# Patient Record
Sex: Male | Born: 1975 | Race: White | Hispanic: No | Marital: Married | Smoking: Current every day smoker
Health system: Southern US, Community
[De-identification: ages and names within clinical notes are randomized; demographics above are authoritative.]

## PROBLEM LIST (undated history)

## (undated) DIAGNOSIS — F419 Anxiety disorder, unspecified: Secondary | ICD-10-CM

## (undated) DIAGNOSIS — R569 Unspecified convulsions: Secondary | ICD-10-CM

## (undated) DIAGNOSIS — F259 Schizoaffective disorder, unspecified: Secondary | ICD-10-CM

## (undated) DIAGNOSIS — R51 Headache: Secondary | ICD-10-CM

## (undated) DIAGNOSIS — R7689 Other specified abnormal immunological findings in serum: Secondary | ICD-10-CM

## (undated) DIAGNOSIS — K8020 Calculus of gallbladder without cholecystitis without obstruction: Secondary | ICD-10-CM

## (undated) DIAGNOSIS — R0600 Dyspnea, unspecified: Secondary | ICD-10-CM

## (undated) DIAGNOSIS — R768 Other specified abnormal immunological findings in serum: Secondary | ICD-10-CM

## (undated) DIAGNOSIS — Z87820 Personal history of traumatic brain injury: Secondary | ICD-10-CM

## (undated) DIAGNOSIS — N189 Chronic kidney disease, unspecified: Secondary | ICD-10-CM

## (undated) DIAGNOSIS — M5030 Other cervical disc degeneration, unspecified cervical region: Secondary | ICD-10-CM

## (undated) DIAGNOSIS — F4310 Post-traumatic stress disorder, unspecified: Secondary | ICD-10-CM

## (undated) DIAGNOSIS — F319 Bipolar disorder, unspecified: Secondary | ICD-10-CM

## (undated) DIAGNOSIS — I10 Essential (primary) hypertension: Secondary | ICD-10-CM

## (undated) DIAGNOSIS — J449 Chronic obstructive pulmonary disease, unspecified: Secondary | ICD-10-CM

## (undated) DIAGNOSIS — F909 Attention-deficit hyperactivity disorder, unspecified type: Secondary | ICD-10-CM

## (undated) HISTORY — DX: Schizoaffective disorder, unspecified: F25.9

## (undated) HISTORY — DX: Bipolar disorder, unspecified: F31.9

## (undated) HISTORY — DX: Anxiety disorder, unspecified: F41.9

## (undated) HISTORY — DX: Headache: R51

## (undated) HISTORY — DX: Post-traumatic stress disorder, unspecified: F43.10

## (undated) HISTORY — DX: Chronic kidney disease, unspecified: N18.9

## (undated) HISTORY — DX: Other specified abnormal immunological findings in serum: R76.89

## (undated) HISTORY — PX: RENAL ARTERY STENT: SHX2321

## (undated) HISTORY — DX: Attention-deficit hyperactivity disorder, unspecified type: F90.9

## (undated) HISTORY — DX: Chronic obstructive pulmonary disease, unspecified: J44.9

## (undated) HISTORY — PX: CHOLECYSTECTOMY: SHX55

## (undated) HISTORY — DX: Calculus of gallbladder without cholecystitis without obstruction: K80.20

## (undated) HISTORY — DX: Personal history of traumatic brain injury: Z87.820

## (undated) HISTORY — DX: Other specified abnormal immunological findings in serum: R76.8

---

## 2000-07-11 ENCOUNTER — Emergency Department (HOSPITAL_COMMUNITY): Admission: EM | Admit: 2000-07-11 | Discharge: 2000-07-11 | Payer: Self-pay | Admitting: *Deleted

## 2000-07-11 ENCOUNTER — Encounter: Payer: Self-pay | Admitting: *Deleted

## 2000-10-25 ENCOUNTER — Emergency Department (HOSPITAL_COMMUNITY): Admission: EM | Admit: 2000-10-25 | Discharge: 2000-10-25 | Payer: Self-pay | Admitting: Emergency Medicine

## 2000-11-17 ENCOUNTER — Encounter: Payer: Self-pay | Admitting: *Deleted

## 2000-11-17 ENCOUNTER — Emergency Department (HOSPITAL_COMMUNITY): Admission: EM | Admit: 2000-11-17 | Discharge: 2000-11-17 | Payer: Self-pay | Admitting: *Deleted

## 2001-04-24 ENCOUNTER — Emergency Department (HOSPITAL_COMMUNITY): Admission: EM | Admit: 2001-04-24 | Discharge: 2001-04-24 | Payer: Self-pay | Admitting: Emergency Medicine

## 2001-10-17 ENCOUNTER — Emergency Department (HOSPITAL_COMMUNITY): Admission: EM | Admit: 2001-10-17 | Discharge: 2001-10-17 | Payer: Self-pay | Admitting: *Deleted

## 2001-10-17 ENCOUNTER — Encounter: Payer: Self-pay | Admitting: *Deleted

## 2001-11-08 ENCOUNTER — Encounter: Payer: Self-pay | Admitting: Orthopaedic Surgery

## 2001-11-08 ENCOUNTER — Ambulatory Visit (HOSPITAL_COMMUNITY): Admission: RE | Admit: 2001-11-08 | Discharge: 2001-11-08 | Payer: Self-pay | Admitting: Orthopaedic Surgery

## 2001-11-28 ENCOUNTER — Encounter: Payer: Self-pay | Admitting: Orthopaedic Surgery

## 2001-11-28 ENCOUNTER — Ambulatory Visit (HOSPITAL_COMMUNITY): Admission: RE | Admit: 2001-11-28 | Discharge: 2001-11-28 | Payer: Self-pay | Admitting: Orthopaedic Surgery

## 2001-12-15 ENCOUNTER — Emergency Department (HOSPITAL_COMMUNITY): Admission: EM | Admit: 2001-12-15 | Discharge: 2001-12-15 | Payer: Self-pay | Admitting: Emergency Medicine

## 2002-01-31 ENCOUNTER — Encounter: Payer: Self-pay | Admitting: Emergency Medicine

## 2002-01-31 ENCOUNTER — Emergency Department (HOSPITAL_COMMUNITY): Admission: EM | Admit: 2002-01-31 | Discharge: 2002-02-01 | Payer: Self-pay | Admitting: Emergency Medicine

## 2002-03-17 ENCOUNTER — Emergency Department (HOSPITAL_COMMUNITY): Admission: EM | Admit: 2002-03-17 | Discharge: 2002-03-17 | Payer: Self-pay | Admitting: *Deleted

## 2002-04-14 ENCOUNTER — Emergency Department (HOSPITAL_COMMUNITY): Admission: EM | Admit: 2002-04-14 | Discharge: 2002-04-14 | Payer: Self-pay | Admitting: Internal Medicine

## 2003-01-17 ENCOUNTER — Emergency Department (HOSPITAL_COMMUNITY): Admission: EM | Admit: 2003-01-17 | Discharge: 2003-01-17 | Payer: Self-pay | Admitting: Emergency Medicine

## 2003-11-09 ENCOUNTER — Emergency Department (HOSPITAL_COMMUNITY): Admission: EM | Admit: 2003-11-09 | Discharge: 2003-11-09 | Payer: Self-pay | Admitting: Emergency Medicine

## 2007-03-03 ENCOUNTER — Emergency Department (HOSPITAL_COMMUNITY): Admission: EM | Admit: 2007-03-03 | Discharge: 2007-03-03 | Payer: Self-pay | Admitting: Emergency Medicine

## 2007-03-06 ENCOUNTER — Emergency Department (HOSPITAL_COMMUNITY): Admission: EM | Admit: 2007-03-06 | Discharge: 2007-03-07 | Payer: Self-pay | Admitting: Emergency Medicine

## 2009-07-01 ENCOUNTER — Emergency Department (HOSPITAL_COMMUNITY): Admission: EM | Admit: 2009-07-01 | Discharge: 2009-07-02 | Payer: Self-pay | Admitting: Emergency Medicine

## 2009-10-16 ENCOUNTER — Emergency Department (HOSPITAL_COMMUNITY): Admission: EM | Admit: 2009-10-16 | Discharge: 2009-10-16 | Payer: Self-pay | Admitting: Emergency Medicine

## 2009-11-09 ENCOUNTER — Emergency Department (HOSPITAL_COMMUNITY): Admission: EM | Admit: 2009-11-09 | Discharge: 2009-11-09 | Payer: Self-pay | Admitting: Emergency Medicine

## 2010-03-10 ENCOUNTER — Emergency Department (HOSPITAL_COMMUNITY)
Admission: EM | Admit: 2010-03-10 | Discharge: 2010-03-10 | Payer: Self-pay | Source: Home / Self Care | Admitting: Emergency Medicine

## 2010-06-08 LAB — DIFFERENTIAL
Basophils Absolute: 0 10*3/uL (ref 0.0–0.1)
Basophils Relative: 0 % (ref 0–1)
Eosinophils Absolute: 0.2 10*3/uL (ref 0.0–0.7)
Eosinophils Relative: 3 % (ref 0–5)
Lymphocytes Relative: 31 % (ref 12–46)
Lymphs Abs: 2.4 10*3/uL (ref 0.7–4.0)
Monocytes Absolute: 0.5 10*3/uL (ref 0.1–1.0)
Monocytes Relative: 7 % (ref 3–12)
Neutro Abs: 4.4 10*3/uL (ref 1.7–7.7)
Neutrophils Relative %: 58 % (ref 43–77)

## 2010-06-08 LAB — COMPREHENSIVE METABOLIC PANEL
ALT: 54 U/L — ABNORMAL HIGH (ref 0–53)
AST: 46 U/L — ABNORMAL HIGH (ref 0–37)
Albumin: 4.4 g/dL (ref 3.5–5.2)
Alkaline Phosphatase: 68 U/L (ref 39–117)
BUN: 8 mg/dL (ref 6–23)
CO2: 26 mEq/L (ref 19–32)
Calcium: 9.7 mg/dL (ref 8.4–10.5)
Chloride: 104 mEq/L (ref 96–112)
Creatinine, Ser: 0.96 mg/dL (ref 0.4–1.5)
GFR calc Af Amer: >60 mL/min (ref >60)
GFR calc non Af Amer: >60 mL/min (ref >60)
Glucose, Bld: 91 mg/dL (ref 70–99)
Potassium: 3.9 mEq/L (ref 3.5–5.1)
Sodium: 139 mEq/L (ref 135–145)
Total Bilirubin: 0.6 mg/dL (ref 0.3–1.2)
Total Protein: 6.8 g/dL (ref 6.0–8.3)

## 2010-06-08 LAB — CBC
HCT: 44 % (ref 39.0–52.0)
Hemoglobin: 16.3 g/dL (ref 13.0–17.0)
MCH: 32 pg (ref 26.0–34.0)
MCHC: 37 g/dL — ABNORMAL HIGH (ref 30.0–36.0)
MCV: 86.3 fL (ref 78.0–100.0)
Platelets: 222 10*3/uL (ref 150–400)
RBC: 5.1 MIL/uL (ref 4.22–5.81)
RDW: 13.1 % (ref 11.5–15.5)
WBC: 7.5 10*3/uL (ref 4.0–10.5)

## 2010-06-08 LAB — LIPASE, BLOOD: Lipase: 32 U/L (ref 11–59)

## 2010-06-12 LAB — URINALYSIS, ROUTINE W REFLEX MICROSCOPIC
Bilirubin Urine: NEGATIVE
Glucose, UA: NEGATIVE mg/dL
Hgb urine dipstick: NEGATIVE
Ketones, ur: NEGATIVE mg/dL
Nitrite: NEGATIVE
Protein, ur: NEGATIVE mg/dL
Specific Gravity, Urine: <1.005 — ABNORMAL LOW (ref 1.005–1.030)
Urobilinogen, UA: 0.2 mg/dL (ref 0.0–1.0)
pH: 6 (ref 5.0–8.0)

## 2010-06-17 LAB — COMPREHENSIVE METABOLIC PANEL
ALT: 27 U/L (ref 0–53)
AST: 24 U/L (ref 0–37)
Albumin: 4.2 g/dL (ref 3.5–5.2)
Alkaline Phosphatase: 63 U/L (ref 39–117)
BUN: 11 mg/dL (ref 6–23)
CO2: 27 mEq/L (ref 19–32)
Calcium: 9.9 mg/dL (ref 8.4–10.5)
Chloride: 105 mEq/L (ref 96–112)
Creatinine, Ser: 0.84 mg/dL (ref 0.4–1.5)
GFR calc Af Amer: >60 mL/min (ref >60)
GFR calc non Af Amer: >60 mL/min (ref >60)
Glucose, Bld: 87 mg/dL (ref 70–99)
Potassium: 4.5 mEq/L (ref 3.5–5.1)
Sodium: 139 mEq/L (ref 135–145)
Total Bilirubin: 0.4 mg/dL (ref 0.3–1.2)
Total Protein: 6.7 g/dL (ref 6.0–8.3)

## 2010-06-17 LAB — DIFFERENTIAL
Basophils Absolute: 0 10*3/uL (ref 0.0–0.1)
Basophils Relative: 1 % (ref 0–1)
Eosinophils Absolute: 0.2 10*3/uL (ref 0.0–0.7)
Eosinophils Relative: 2 % (ref 0–5)
Lymphocytes Relative: 27 % (ref 12–46)
Lymphs Abs: 2.2 10*3/uL (ref 0.7–4.0)
Monocytes Absolute: 0.4 10*3/uL (ref 0.1–1.0)
Monocytes Relative: 5 % (ref 3–12)
Neutro Abs: 5.3 10*3/uL (ref 1.7–7.7)
Neutrophils Relative %: 66 % (ref 43–77)

## 2010-06-17 LAB — CBC
HCT: 41.3 % (ref 39.0–52.0)
Hemoglobin: 14.6 g/dL (ref 13.0–17.0)
MCHC: 35.3 g/dL (ref 30.0–36.0)
MCV: 89.6 fL (ref 78.0–100.0)
Platelets: 232 10*3/uL (ref 150–400)
RBC: 4.6 MIL/uL (ref 4.22–5.81)
RDW: 13.3 % (ref 11.5–15.5)
WBC: 8.1 10*3/uL (ref 4.0–10.5)

## 2010-06-17 LAB — PROTIME-INR
INR: 1.08 (ref 0.00–1.49)
Prothrombin Time: 13.9 seconds (ref 11.6–15.2)

## 2010-06-17 LAB — LIPASE, BLOOD: Lipase: 23 U/L (ref 11–59)

## 2010-06-17 LAB — PHENYTOIN LEVEL, TOTAL: Phenytoin Lvl: <2.5 ug/mL — ABNORMAL LOW (ref 10.0–20.0)

## 2011-01-04 LAB — URINALYSIS, ROUTINE W REFLEX MICROSCOPIC
Bilirubin Urine: NEGATIVE
Glucose, UA: NEGATIVE
Ketones, ur: NEGATIVE
Leukocytes, UA: NEGATIVE
Nitrite: NEGATIVE
Protein, ur: NEGATIVE
Specific Gravity, Urine: 1.01
Urobilinogen, UA: 1
pH: 6

## 2011-01-04 LAB — BASIC METABOLIC PANEL
BUN: 8
CO2: 25
Calcium: 9.4
Chloride: 103
Creatinine, Ser: 0.7
GFR calc Af Amer: >60
GFR calc non Af Amer: >60
Glucose, Bld: 129 — ABNORMAL HIGH
Potassium: 3.5
Sodium: 138

## 2011-01-04 LAB — CBC
HCT: 40.9
Hemoglobin: 13.9
MCHC: 33.9
MCV: 88.4
Platelets: 274
RBC: 4.63
RDW: 13.1
WBC: 6.2

## 2011-01-04 LAB — RAPID URINE DRUG SCREEN, HOSP PERFORMED
Amphetamines: NOT DETECTED
Barbiturates: NOT DETECTED
Benzodiazepines: NOT DETECTED
Cocaine: NOT DETECTED
Opiates: NOT DETECTED
Tetrahydrocannabinol: POSITIVE — AB

## 2011-01-04 LAB — DIFFERENTIAL
Basophils Absolute: 0
Basophils Relative: 0
Eosinophils Absolute: 0.1 — ABNORMAL LOW
Eosinophils Relative: 1
Lymphocytes Relative: 22
Lymphs Abs: 1.3
Monocytes Absolute: 0.3
Monocytes Relative: 6
Neutro Abs: 4.4
Neutrophils Relative %: 71

## 2011-01-04 LAB — URINE MICROSCOPIC-ADD ON

## 2011-01-04 LAB — ETHANOL: Alcohol, Ethyl (B): <5
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
## 2011-12-09 ENCOUNTER — Ambulatory Visit (INDEPENDENT_AMBULATORY_CARE_PROVIDER_SITE_OTHER): Payer: Medicare Other | Admitting: Psychology

## 2011-12-09 DIAGNOSIS — F411 Generalized anxiety disorder: Secondary | ICD-10-CM

## 2011-12-09 DIAGNOSIS — F419 Anxiety disorder, unspecified: Secondary | ICD-10-CM

## 2011-12-09 DIAGNOSIS — F39 Unspecified mood [affective] disorder: Secondary | ICD-10-CM

## 2011-12-09 DIAGNOSIS — F4310 Post-traumatic stress disorder, unspecified: Secondary | ICD-10-CM

## 2011-12-23 ENCOUNTER — Encounter (HOSPITAL_COMMUNITY): Payer: Self-pay | Admitting: Psychology
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
## 2011-12-24 ENCOUNTER — Ambulatory Visit (HOSPITAL_COMMUNITY): Payer: Self-pay | Admitting: Psychology

## 2012-01-04 ENCOUNTER — Ambulatory Visit (HOSPITAL_COMMUNITY): Payer: Self-pay | Admitting: Psychiatry

## 2012-01-24 ENCOUNTER — Encounter (HOSPITAL_COMMUNITY): Payer: Self-pay | Admitting: Psychiatry

## 2012-01-24 ENCOUNTER — Ambulatory Visit (INDEPENDENT_AMBULATORY_CARE_PROVIDER_SITE_OTHER): Payer: Medicare Other | Admitting: Psychiatry

## 2012-01-24 VITALS — Ht 73.25 in | Wt 176.2 lb

## 2012-01-24 DIAGNOSIS — F0781 Postconcussional syndrome: Secondary | ICD-10-CM

## 2012-01-24 DIAGNOSIS — R768 Other specified abnormal immunological findings in serum: Secondary | ICD-10-CM

## 2012-01-24 DIAGNOSIS — F17200 Nicotine dependence, unspecified, uncomplicated: Secondary | ICD-10-CM

## 2012-01-24 DIAGNOSIS — F515 Nightmare disorder: Secondary | ICD-10-CM

## 2012-01-24 DIAGNOSIS — R519 Headache, unspecified: Secondary | ICD-10-CM | POA: Insufficient documentation

## 2012-01-24 DIAGNOSIS — F4310 Post-traumatic stress disorder, unspecified: Secondary | ICD-10-CM

## 2012-01-24 DIAGNOSIS — R51 Headache: Secondary | ICD-10-CM

## 2012-01-24 DIAGNOSIS — T424X5A Adverse effect of benzodiazepines, initial encounter: Secondary | ICD-10-CM

## 2012-01-24 DIAGNOSIS — F411 Generalized anxiety disorder: Secondary | ICD-10-CM

## 2012-01-24 MED ORDER — OMEGA-3-ACID ETHYL ESTERS 1 G PO CAPS: 1.0000 g | ORAL_CAPSULE | Freq: Two times a day (BID) | ORAL | Status: DC

## 2012-01-24 MED ORDER — PRAZOSIN HCL 2 MG PO CAPS: 4.0000 mg | ORAL_CAPSULE | Freq: Every evening | ORAL | Status: DC | PRN

## 2012-01-24 MED ORDER — CARBAMAZEPINE 200 MG PO TABS: 200.0000 mg | ORAL_TABLET | Freq: Three times a day (TID) | ORAL | Status: DC

## 2012-01-24 MED ORDER — GABAPENTIN 300 MG PO CAPS: 300.0000 mg | ORAL_CAPSULE | Freq: Four times a day (QID) | ORAL | Status: DC

## 2012-01-24 MED ORDER — NICOTINE 10 MG IN INHA: 1.0000 | RESPIRATORY_TRACT | Status: DC | PRN

## 2012-01-24 NOTE — Progress Notes (Signed)
  [facility] [id number] Progress Note  Howard Bishop [id number] 36 y.o.  01/24/2012 9:57 AM  Chief Complaint: See boxes  History of Present Illness: Suicidal Ideation: No Plan Formed: No Patient has means to carry out plan: No  Homicidal Ideation: No Plan Formed: No Patient has means to carry out plan: No  Review of Systems: Psychiatric: Agitation: Yes Hallucination: not lately Depressed Mood: Yes Insomnia: Yes Hypersomnia: No Altered Concentration: Yes Feels Worthless: Yes Grandiose Ideas: No Belief In Special Powers: No New/Increased Substance Abuse: Yes, states he has decreased, but got script for opiates in Aug. Compulsions: Yes  Neurologic: Headache: Yes Seizure: Yes Paresthesias: No  Past Medical Family, Social History: see history  Outpatient Encounter Prescriptions as of 01/24/2012  Medication Sig Dispense Refill  . ALPRAZolam (XANAX) 1 MG tablet Take 1 mg by mouth 3 (three) times daily.      Marland Kitchen omeprazole (PRILOSEC) 20 MG capsule Take 20 mg by mouth daily.      . traZODone (DESYREL) 50 MG tablet Take 50 mg by mouth at bedtime.        Past Psychiatric History/Hospitalization(s): Anxiety: Yes Bipolar Disorder: Yes Depression: Yes Mania: No Psychosis: No Schizophrenia: No Personality Disorder: No Hospitalization for psychiatric illness: Yes History of Electroconvulsive Shock Therapy: No Prior Suicide Attempts: Yes  Physical Exam: Constitutional:  There were no vitals taken for this visit.  General Appearance: very anxious  Musculoskeletal: Strength & Muscle Tone: within normal limits Gait & Station: normal Patient leans: Left  Psychiatric: Speech (describe rate, volume, coherence, spontaneity, and abnormalities if any): typical rate and volume  Thought Process (describe rate, content, abstract reasoning, and computation): typical  Associations: Relevant  Thoughts: typical  Mental Status: Orientation: oriented to person,  place and time/date Mood & Affect: anxiety and agitation Attention Span & Concentration: able to focus in one on one situation  Medical Decision Making (Choose Three): New problem, with additional work up planned  Assessment: Axis I: Chronic Traumatic Encephalopathy, PTSD, GAD, Benzodiazepine causing adverse reaction in chronic use, Nicotine Dependence  Axis II: Deferred  Axis III: Hep C, Headaches, History of brain injury  Axis IV: Psychosocial stressors  Axis V: 55  Plan: Start Tegretol, Lovaza, restart Neurontin, Minipress, Nicoderm Current outpatient prescriptions:carbamazepine (TEGRETOL) 200 MG tablet, Take 1 tablet (200 mg total) by mouth 3 (three) times daily., Disp: 90 tablet, Rfl: 2;  gabapentin (NEURONTIN) 300 MG capsule, Take 1 capsule (300 mg total) by mouth 4 (four) times daily., Disp: 120 capsule, Rfl: 2;  nicotine (NICOTROL) 10 MG inhaler, Inhale 1 puff into the lungs as needed for smoking cessation., Disp: 42 each, Rfl: 0 omega-3 acid ethyl esters (LOVAZA) 1 G capsule, Take 1 capsule (1 g total) by mouth 2 (two) times daily., Disp: 60 capsule, Rfl: 2;  omeprazole (PRILOSEC) 20 MG capsule, Take 20 mg by mouth daily., Disp: , Rfl:  Current facility-administered medications:prazosin (MINIPRESS) capsule 4 mg, 4 mg, Oral, QHS,MR X 1, Mike Craze, MD;  DISCONTD: prazosin (MINIPRESS) capsule 4 mg, 4 mg, Oral, QHS,MR X 1, Mike Craze, MD Talking therapy with Dr Kieth [facility] in 1 week to follow liver enzymes and Teg level RTC 4 weeks for 30 minute to see how all these changes effect pt and later shift to q 15 min checks. Recc that he follow up with [facility] meetings.  [name], MD 01/24/2012

## 2012-01-24 NOTE — Patient Instructions (Addendum)
STOP all opiates, alcohol, benzos, nicotine, and cut way back on caffeine.  BENZOS CAUTION: NEVER take Xanax (alprazolam), Klonopin (clonazepam), Ativan (lorazepam), Valium (diazepam), Serax (oxazepam), or Librium (chlordiazepoxide) as they are in the group of BENZODIAZEPINES which caused your disinhibition and forgetfulness.  These are often handed out like skittles for anxiety, but they are a controlled substance for a reason.  They cloud your thinking, cause depression, and disinhibit people to the point that they end up hurting someone or themselves and they often cause forgetfulness.  Some people just accept their forgetfulness as getting older, but this is entirely reversible when you stop taking the pills.  Please notify your pharmacy and future pharmacies and all your prescribers and future prescribers about this reaction that you had to Xanax.  Ask the prescribers to respect this reaction and never consider such medications for you in the future.  For what is believed to be chronic tramatic encephalopathy, it advised that you get  regular exercise, regular sleep, and  consume good quality, fish oil, 1000 mg twice a day. These 3 things are the foundation of rehabilitating your brain. Staying off all abusable substances including nicotine, caffeine, and refined sugar and avoiding further head injuries are the other important elements in helping you keep your brain working the best it can for you. If memory is a problem then INSTEAD of the fish oil mentioned above, try using Brain Power Basics from [employer].  You can order online or by phone [phone]. It costs $99 for the first month, and $80 monthly thereafter, but that investment in your brain and the recovery of your brain proper functioning would seem worth it.  Stevia would be a good alternative for sweetener.  Strongly consider attending at least 6 [facility] Meetings to help you learn about how your helping others to the exclusion of  helping yourself is actually hurting yourself and is actually an addiction to fixing others and that you need to work the 12 Step to Happiness through the [employer]. [facility] Family Groups could be helpful with how to deal with substance abusing family and friends. Or your own issues of being in victim role.  There are only 40 [facility] Family Group meetings a week here in [place].  Online are current listing of those meetings @ [email or web address]  There are [employer].  Search on line and there you can learn the format and can access the schedule for yourself.  Their number is [phone]

## 2012-02-21 ENCOUNTER — Ambulatory Visit (HOSPITAL_COMMUNITY): Payer: Self-pay | Admitting: Psychiatry

## 2012-04-30 ENCOUNTER — Encounter (HOSPITAL_COMMUNITY): Payer: Self-pay | Admitting: Emergency Medicine

## 2012-04-30 ENCOUNTER — Emergency Department (HOSPITAL_COMMUNITY): Payer: Medicare Other

## 2012-04-30 ENCOUNTER — Emergency Department (HOSPITAL_COMMUNITY)
Admission: EM | Admit: 2012-04-30 | Discharge: 2012-04-30 | Disposition: A | Discharge status: Home / Self Care | Payer: Medicare Other | Attending: Emergency Medicine | Admitting: Emergency Medicine

## 2012-04-30 DIAGNOSIS — F909 Attention-deficit hyperactivity disorder, unspecified type: Secondary | ICD-10-CM | POA: Insufficient documentation

## 2012-04-30 DIAGNOSIS — W1809XA Striking against other object with subsequent fall, initial encounter: Secondary | ICD-10-CM | POA: Insufficient documentation

## 2012-04-30 DIAGNOSIS — Z8619 Personal history of other infectious and parasitic diseases: Secondary | ICD-10-CM | POA: Insufficient documentation

## 2012-04-30 DIAGNOSIS — F1210 Cannabis abuse, uncomplicated: Secondary | ICD-10-CM | POA: Insufficient documentation

## 2012-04-30 DIAGNOSIS — F17200 Nicotine dependence, unspecified, uncomplicated: Secondary | ICD-10-CM | POA: Insufficient documentation

## 2012-04-30 DIAGNOSIS — G40909 Epilepsy, unspecified, not intractable, without status epilepticus: Secondary | ICD-10-CM | POA: Insufficient documentation

## 2012-04-30 DIAGNOSIS — S022XXA Fracture of nasal bones, initial encounter for closed fracture: Secondary | ICD-10-CM | POA: Insufficient documentation

## 2012-04-30 DIAGNOSIS — Z87820 Personal history of traumatic brain injury: Secondary | ICD-10-CM | POA: Insufficient documentation

## 2012-04-30 DIAGNOSIS — F4310 Post-traumatic stress disorder, unspecified: Secondary | ICD-10-CM | POA: Insufficient documentation

## 2012-04-30 DIAGNOSIS — Y9289 Other specified places as the place of occurrence of the external cause: Secondary | ICD-10-CM | POA: Insufficient documentation

## 2012-04-30 DIAGNOSIS — S0990XA Unspecified injury of head, initial encounter: Secondary | ICD-10-CM | POA: Insufficient documentation

## 2012-04-30 DIAGNOSIS — R32 Unspecified urinary incontinence: Secondary | ICD-10-CM | POA: Insufficient documentation

## 2012-04-30 DIAGNOSIS — F411 Generalized anxiety disorder: Secondary | ICD-10-CM | POA: Insufficient documentation

## 2012-04-30 DIAGNOSIS — F319 Bipolar disorder, unspecified: Secondary | ICD-10-CM | POA: Insufficient documentation

## 2012-04-30 DIAGNOSIS — Z79899 Other long term (current) drug therapy: Secondary | ICD-10-CM | POA: Insufficient documentation

## 2012-04-30 DIAGNOSIS — R569 Unspecified convulsions: Secondary | ICD-10-CM

## 2012-04-30 DIAGNOSIS — I10 Essential (primary) hypertension: Secondary | ICD-10-CM | POA: Insufficient documentation

## 2012-04-30 DIAGNOSIS — Y9389 Activity, other specified: Secondary | ICD-10-CM | POA: Insufficient documentation

## 2012-04-30 HISTORY — DX: Essential (primary) hypertension: I10

## 2012-04-30 HISTORY — DX: Unspecified convulsions: R56.9

## 2012-04-30 LAB — CBC WITH DIFFERENTIAL/PLATELET
Basophils Absolute: 0 10*3/uL (ref 0.0–0.1)
Basophils Relative: 0 % (ref 0–1)
Eosinophils Absolute: 0.2 10*3/uL (ref 0.0–0.7)
Eosinophils Relative: 3 % (ref 0–5)
HCT: 41.9 % (ref 39.0–52.0)
Hemoglobin: 14.7 g/dL (ref 13.0–17.0)
Lymphocytes Relative: 35 % (ref 12–46)
Lymphs Abs: 2.7 10*3/uL (ref 0.7–4.0)
MCH: 31.7 pg (ref 26.0–34.0)
MCHC: 35.1 g/dL (ref 30.0–36.0)
MCV: 90.3 fL (ref 78.0–100.0)
Monocytes Absolute: 0.5 10*3/uL (ref 0.1–1.0)
Monocytes Relative: 6 % (ref 3–12)
Neutro Abs: 4.2 10*3/uL (ref 1.7–7.7)
Neutrophils Relative %: 56 % (ref 43–77)
Platelets: 241 10*3/uL (ref 150–400)
RBC: 4.64 MIL/uL (ref 4.22–5.81)
RDW: 13 % (ref 11.5–15.5)
WBC: 7.6 10*3/uL (ref 4.0–10.5)

## 2012-04-30 LAB — RAPID URINE DRUG SCREEN, HOSP PERFORMED
Amphetamines: NOT DETECTED
Barbiturates: NOT DETECTED
Benzodiazepines: POSITIVE — AB
Cocaine: NOT DETECTED
Opiates: NOT DETECTED
Tetrahydrocannabinol: POSITIVE — AB

## 2012-04-30 LAB — COMPREHENSIVE METABOLIC PANEL
ALT: 23 U/L (ref 0–53)
AST: 23 U/L (ref 0–37)
Albumin: 3.7 g/dL (ref 3.5–5.2)
Alkaline Phosphatase: 55 U/L (ref 39–117)
BUN: 13 mg/dL (ref 6–23)
CO2: 26 mEq/L (ref 19–32)
Calcium: 9.8 mg/dL (ref 8.4–10.5)
Chloride: 101 mEq/L (ref 96–112)
Creatinine, Ser: 0.86 mg/dL (ref 0.50–1.35)
GFR calc Af Amer: >90 mL/min (ref >90)
GFR calc non Af Amer: >90 mL/min (ref >90)
Glucose, Bld: 113 mg/dL — ABNORMAL HIGH (ref 70–99)
Potassium: 3.8 mEq/L (ref 3.5–5.1)
Sodium: 137 mEq/L (ref 135–145)
Total Bilirubin: 0.3 mg/dL (ref 0.3–1.2)
Total Protein: 6.5 g/dL (ref 6.0–8.3)

## 2012-04-30 LAB — ETHANOL: Alcohol, Ethyl (B): <11 mg/dL (ref 0–11)

## 2012-04-30 MED ORDER — SODIUM CHLORIDE 0.9 % IV BOLUS (SEPSIS)
1000.0000 mL | Freq: Once | INTRAVENOUS | Status: AC
  Administered 2012-04-30: 1000 mL via INTRAVENOUS

## 2012-04-30 MED ORDER — HYDROCODONE-ACETAMINOPHEN 5-325 MG PO TABS: 2.0000 | ORAL_TABLET | ORAL | Status: DC | PRN

## 2012-04-30 MED ORDER — LEVETIRACETAM 500 MG/5ML IV SOLN
INTRAVENOUS | Status: AC
  Filled 2012-04-30: qty 10

## 2012-04-30 MED ORDER — SODIUM CHLORIDE 0.9 % IV SOLN
1000.0000 mg | Freq: Once | INTRAVENOUS | Status: AC
  Administered 2012-04-30: 1000 mg via INTRAVENOUS
  Filled 2012-04-30: qty 10

## 2012-04-30 MED ORDER — CEPHALEXIN 500 MG PO CAPS: 500.0000 mg | ORAL_CAPSULE | Freq: Four times a day (QID) | ORAL | Status: DC

## 2012-04-30 MED ORDER — HYDROCODONE-ACETAMINOPHEN 5-325 MG PO TABS
2.0000 | ORAL_TABLET | Freq: Once | ORAL | Status: AC
  Administered 2012-04-30: 2 via ORAL
  Filled 2012-04-30: qty 2

## 2012-04-30 MED ORDER — LEVETIRACETAM 500 MG PO TABS: 500.0000 mg | ORAL_TABLET | Freq: Two times a day (BID) | ORAL | Status: DC

## 2012-04-30 NOTE — ED Provider Notes (Signed)
History     CSN: [id number]  Arrival date & time 04/30/12  1956   First MD Initiated Contact with Patient 04/30/12 2008      Chief Complaint  Patient presents with  . Seizures    wife states two seizures today - last 30 min ago    (Consider location/radiation/quality/duration/timing/severity/associated sxs/prior treatment) HPI Comments: Patient presents from home after witnessed seizure activity today. Patient has remote history of brain injury with intermittent breakthrough seizures but is not on medication. States he used to take Dilantin and Depakote they were stopped a year ago. Wife heard him fall in bathroom twice a day in his face against the tub. She thinks his seizures for 30 seconds to 1 minute. He did not bite his tongue but did have urinary incontinence. Denies any fevers, chills, cough or congestion. Wife states patient lacerated her seizure around Christmas but denied to the hospital. His seizures have not been this bad since 2011. She reports he is back to baseline now.  The history is provided by the patient and the spouse.    Past Medical History  Diagnosis Date  . ADHD (attention deficit hyperactivity disorder)   . Bipolar disorder   . Anxiety   . PTSD (post-traumatic stress disorder)   . Hepatitis C antibody test positive   . Headache   . Seizures   . Hypertension     Past Surgical History  Procedure Date  . Renal artery stent     Family History  Problem Relation Age of Onset  . Alcohol abuse Father     History  Substance Use Topics  . Smoking status: Current Every Day Smoker -- 0.5 packs/day    Types: Cigarettes    Start date: 03/29/1990  . Smokeless tobacco: Former Neurosurgeon    Types: Chew     Comment: Agrees to start 14mg   . Alcohol Use: No      Review of Systems  Constitutional: Negative for fever, activity change and appetite change.  HENT: Positive for neck pain. Negative for sore throat.   Respiratory: Negative for cough, chest tightness  and shortness of breath.   Cardiovascular: Negative for chest pain.  Gastrointestinal: Negative for nausea, vomiting and abdominal pain.  Genitourinary: Negative for urgency.  Musculoskeletal: Negative for back pain.  Skin: Negative for rash.  Neurological: Positive for seizures and headaches. Negative for dizziness.  A complete 10 system review of systems was obtained and all systems are negative except as noted in the HPI and PMH.    Allergies  Asa; Benzodiazepines; Penicillins; Tomato; and Ultram  Home Medications   Current Outpatient Rx  Name  Route  Sig  Dispense  Refill  . ALPRAZOLAM 1 MG PO TABS   Oral   Take 1 mg by mouth 3 (three) times daily.         Marland Kitchen GABAPENTIN 300 MG PO CAPS   Oral   Take 1 capsule (300 mg total) by mouth 4 (four) times daily.   120 capsule   2   . LISINOPRIL 10 MG PO TABS   Oral   Take 10 mg by mouth daily.         . OMEGA-3-ACID ETHYL ESTERS 1 G PO CAPS   Oral   Take 1 capsule (1 g total) by mouth 2 (two) times daily.   60 capsule   2   . OMEPRAZOLE 20 MG PO CPDR   Oral   Take 20 mg by mouth daily.         Marland Kitchen  TRAZODONE HCL 50 MG PO TABS   Oral   Take 50 mg by mouth at bedtime.         Marland Kitchen CARBAMAZEPINE 200 MG PO TABS   Oral   Take 1 tablet (200 mg total) by mouth 3 (three) times daily.   90 tablet   2   . CEPHALEXIN 500 MG PO CAPS   Oral   Take 1 capsule (500 mg total) by mouth 4 (four) times daily.   40 capsule   0   . HYDROCODONE-ACETAMINOPHEN 5-325 MG PO TABS   Oral   Take 2 tablets by mouth every 4 (four) hours as needed for pain.   10 tablet   0   . LEVETIRACETAM 500 MG PO TABS   Oral   Take 1 tablet (500 mg total) by mouth every 12 (twelve) hours.   60 tablet   0   . NICOTINE 10 MG IN INHA   Inhalation   Inhale 1 puff into the lungs as needed for smoking cessation.   42 each   0     BP 115/79  Pulse 73  Temp 97.6 F (36.4 C) (Oral)  Resp 17  SpO2 99%  Physical Exam  Constitutional: He is  oriented to person, place, and time. He appears well-developed and well-nourished. No distress.  HENT:  Head: Normocephalic and atraumatic.  Mouth/Throat: Oropharynx is clear and moist. No oropharyngeal exudate.       Dried blood in bilateral nares. No active bleeding. No septal hematoma.  Eyes: Conjunctivae normal and EOM are normal. Pupils are equal, round, and reactive to light.  Neck: Normal range of motion. Neck supple.       Diffuse tenderness to palpation of C spine  Cardiovascular: Normal rate, regular rhythm and normal heart sounds.   No murmur heard. Pulmonary/Chest: Effort normal and breath sounds normal. No respiratory distress.  Abdominal: Soft. There is no tenderness. There is no rebound and no guarding.  Musculoskeletal: Normal range of motion. He exhibits no edema and no tenderness.  Neurological: He is alert and oriented to person, place, and time. No cranial nerve deficit. He exhibits normal muscle tone. Coordination normal.       5/5 strength throughout, CN 2-12 intact.  Skin: Skin is warm.    ED Course  Procedures (including critical care time)  Labs Reviewed  COMPREHENSIVE METABOLIC PANEL - Abnormal; Notable for the following:    Glucose, Bld 113 (*)     All other components within normal limits  URINE RAPID DRUG SCREEN (HOSP PERFORMED) - Abnormal; Notable for the following:    Benzodiazepines POSITIVE (*)     Tetrahydrocannabinol POSITIVE (*)     All other components within normal limits  CBC WITH DIFFERENTIAL  ETHANOL  LAB REPORT - SCANNED   Ct Head Wo Contrast  04/30/2012  *RADIOLOGY REPORT*  Clinical Data:  Seizures.  Headache and neck pain.  Nosebleed.  CT HEAD WITHOUT CONTRAST CT MAXILLOFACIAL WITHOUT CONTRAST CT CERVICAL SPINE WITHOUT CONTRAST  Technique:  Multidetector CT imaging of the head, cervical spine, and maxillofacial structures were performed using the standard protocol without intravenous contrast. Multiplanar CT image reconstructions of the  cervical spine and maxillofacial structures were also generated.  Comparison:   None  CT HEAD  Findings: Nasal bones are fracture with minimal displacement.  No skull fracture.  No intracranial hemorrhage.  No hydrocephalus. No CT evidence of large acute infarct.  No seizure focus identified on this unenhanced exam.  MR would  prove more sensitive for detection of such.  No intracranial mass lesion detected on this unenhanced exam.  IMPRESSION: No skull fracture or intracranial hemorrhage.  Bilateral nasal bone fracture.  CT MAXILLOFACIAL  Findings:  Bilateral nasal bone fracture with minimal displacement. Anterior superior nasal septum is fractured angulated.  No other facial fracture noted.  Caries.  Peri apical lucency noted involve several teeth.  IMPRESSION: Bilateral nasal bone fracture with minimal displacement. Anterior superior nasal septum is fractured angulated.  No other facial fracture noted.  Caries.  Peri apical lucency noted involve several teeth.  CT CERVICAL SPINE  Findings:   No cervical spine fracture or malalignment.  No abnormal prevertebral soft tissue swelling.  Minimal amount of gas anterior inferior aspect C6-7 disc space probably related to degenerative changes.  If there is a high clinical suspicion for ligamentous injury, flexion/extension views or MR can be obtained for further delineation.  Cervical spondylotic changes with various degrees of spinal stenosis and foraminal narrowing.  IMPRESSION: No cervical spine fracture or dislocation.  Please see above.   Original Report Authenticated By: Lacy Duverney, M.D.    Ct Cervical Spine Wo Contrast  04/30/2012  *RADIOLOGY REPORT*  Clinical Data:  Seizures.  Headache and neck pain.  Nosebleed.  CT HEAD WITHOUT CONTRAST CT MAXILLOFACIAL WITHOUT CONTRAST CT CERVICAL SPINE WITHOUT CONTRAST  Technique:  Multidetector CT imaging of the head, cervical spine, and maxillofacial structures were performed using the standard protocol without  intravenous contrast. Multiplanar CT image reconstructions of the cervical spine and maxillofacial structures were also generated.  Comparison:   None  CT HEAD  Findings: Nasal bones are fracture with minimal displacement.  No skull fracture.  No intracranial hemorrhage.  No hydrocephalus. No CT evidence of large acute infarct.  No seizure focus identified on this unenhanced exam.  MR would prove more sensitive for detection of such.  No intracranial mass lesion detected on this unenhanced exam.  IMPRESSION: No skull fracture or intracranial hemorrhage.  Bilateral nasal bone fracture.  CT MAXILLOFACIAL  Findings:  Bilateral nasal bone fracture with minimal displacement. Anterior superior nasal septum is fractured angulated.  No other facial fracture noted.  Caries.  Peri apical lucency noted involve several teeth.  IMPRESSION: Bilateral nasal bone fracture with minimal displacement. Anterior superior nasal septum is fractured angulated.  No other facial fracture noted.  Caries.  Peri apical lucency noted involve several teeth.  CT CERVICAL SPINE  Findings:   No cervical spine fracture or malalignment.  No abnormal prevertebral soft tissue swelling.  Minimal amount of gas anterior inferior aspect C6-7 disc space probably related to degenerative changes.  If there is a high clinical suspicion for ligamentous injury, flexion/extension views or MR can be obtained for further delineation.  Cervical spondylotic changes with various degrees of spinal stenosis and foraminal narrowing.  IMPRESSION: No cervical spine fracture or dislocation.  Please see above.   Original Report Authenticated By: Lacy Duverney, M.D.    Ct Maxillofacial Wo Cm  04/30/2012  *RADIOLOGY REPORT*  Clinical Data:  Seizures.  Headache and neck pain.  Nosebleed.  CT HEAD WITHOUT CONTRAST CT MAXILLOFACIAL WITHOUT CONTRAST CT CERVICAL SPINE WITHOUT CONTRAST  Technique:  Multidetector CT imaging of the head, cervical spine, and maxillofacial structures  were performed using the standard protocol without intravenous contrast. Multiplanar CT image reconstructions of the cervical spine and maxillofacial structures were also generated.  Comparison:   None  CT HEAD  Findings: Nasal bones are fracture with minimal displacement.  No skull  fracture.  No intracranial hemorrhage.  No hydrocephalus. No CT evidence of large acute infarct.  No seizure focus identified on this unenhanced exam.  MR would prove more sensitive for detection of such.  No intracranial mass lesion detected on this unenhanced exam.  IMPRESSION: No skull fracture or intracranial hemorrhage.  Bilateral nasal bone fracture.  CT MAXILLOFACIAL  Findings:  Bilateral nasal bone fracture with minimal displacement. Anterior superior nasal septum is fractured angulated.  No other facial fracture noted.  Caries.  Peri apical lucency noted involve several teeth.  IMPRESSION: Bilateral nasal bone fracture with minimal displacement. Anterior superior nasal septum is fractured angulated.  No other facial fracture noted.  Caries.  Peri apical lucency noted involve several teeth.  CT CERVICAL SPINE  Findings:   No cervical spine fracture or malalignment.  No abnormal prevertebral soft tissue swelling.  Minimal amount of gas anterior inferior aspect C6-7 disc space probably related to degenerative changes.  If there is a high clinical suspicion for ligamentous injury, flexion/extension views or MR can be obtained for further delineation.  Cervical spondylotic changes with various degrees of spinal stenosis and foraminal narrowing.  IMPRESSION: No cervical spine fracture or dislocation.  Please see above.   Original Report Authenticated By: Lacy Duverney, M.D.      1. Seizures   2. Nasal bone fracture       MDM  Seizure episode at home with history of traumatic brain injury, not on antiepileptics.  Dried blood in nares without active bleeding, moving all extremities  CT head neg. CT c spine negative.   Bilateral nasal bone fractures and nasal septum fracture. Nose not deviated clinically.  Discussed with Dr. Gerilyn Pilgrim of neurology. Agrees with starting antiepileptic. He agrees with keppra. He will follow up patient in clinic. He agrees with discharge as patient is back to baseline.  Patient is ambulatory in ED and at his baseline per wife. NO further seizure activity. F/u ENT regarding nasal fractures. Sinus precautions and antibiotics given.   Date: 04/30/2012  Rate: 74  Rhythm: normal sinus rhythm  QRS Axis: normal  Intervals: normal  ST/T Wave abnormalities: normal  Conduction Disutrbances:none  Narrative Interpretation:   Old EKG Reviewed: none available      Glynn Octave, MD 05/01/12 1144

## 2012-04-30 NOTE — ED Notes (Signed)
Ambulated pt around nurses station without any issues noted. Pt states it took him a moment when he got up first, but after that he felt fine and did not notice any dizziness or lightheadedness.

## 2012-04-30 NOTE — ED Notes (Addendum)
Wife states first seizure occurred in bathroom and she thinks he struck the right forehead on tub.  Second seizure found patient on floor in bathroom and nose was bleeding profusely.  Ice pack applied upon arrival to ED and will use intermittantly

## 2013-05-31 ENCOUNTER — Encounter (HOSPITAL_COMMUNITY): Payer: Self-pay | Admitting: Psychiatry

## 2013-05-31 ENCOUNTER — Ambulatory Visit (INDEPENDENT_AMBULATORY_CARE_PROVIDER_SITE_OTHER): Payer: Medicare Other | Admitting: Psychiatry

## 2013-05-31 VITALS — BP 140/80 | Ht 73.0 in | Wt 183.0 lb

## 2013-05-31 DIAGNOSIS — F1010 Alcohol abuse, uncomplicated: Secondary | ICD-10-CM

## 2013-05-31 DIAGNOSIS — F259 Schizoaffective disorder, unspecified: Secondary | ICD-10-CM

## 2013-05-31 DIAGNOSIS — F4310 Post-traumatic stress disorder, unspecified: Secondary | ICD-10-CM

## 2013-05-31 DIAGNOSIS — F0781 Postconcussional syndrome: Secondary | ICD-10-CM

## 2013-05-31 DIAGNOSIS — T424X5A Adverse effect of benzodiazepines, initial encounter: Secondary | ICD-10-CM

## 2013-05-31 MED ORDER — GABAPENTIN 300 MG PO CAPS: 300.0000 mg | ORAL_CAPSULE | Freq: Four times a day (QID) | ORAL | Status: DC

## 2013-05-31 MED ORDER — PRAZOSIN HCL 5 MG PO CAPS: 5.0000 mg | ORAL_CAPSULE | Freq: Every day | ORAL | Status: DC

## 2013-05-31 MED ORDER — ALPRAZOLAM 1 MG PO TABS: 1.0000 mg | ORAL_TABLET | Freq: Three times a day (TID) | ORAL | Status: DC
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
## 2013-06-13 ENCOUNTER — Telehealth (HOSPITAL_COMMUNITY): Payer: Self-pay | Admitting: *Deleted

## 2013-06-20 ENCOUNTER — Ambulatory Visit (INDEPENDENT_AMBULATORY_CARE_PROVIDER_SITE_OTHER): Payer: Medicare Other | Admitting: Psychology

## 2013-06-20 ENCOUNTER — Encounter (HOSPITAL_COMMUNITY): Payer: Self-pay | Admitting: Psychology

## 2013-06-20 DIAGNOSIS — F4310 Post-traumatic stress disorder, unspecified: Secondary | ICD-10-CM

## 2013-06-20 DIAGNOSIS — F39 Unspecified mood [affective] disorder: Secondary | ICD-10-CM
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
## 2013-06-22 ENCOUNTER — Ambulatory Visit (INDEPENDENT_AMBULATORY_CARE_PROVIDER_SITE_OTHER): Payer: Medicare Other | Admitting: Psychiatry

## 2013-06-22 ENCOUNTER — Encounter (HOSPITAL_COMMUNITY): Payer: Self-pay | Admitting: Psychiatry

## 2013-06-22 ENCOUNTER — Ambulatory Visit (HOSPITAL_COMMUNITY): Payer: Self-pay | Admitting: Psychiatry

## 2013-06-22 VITALS — BP 110/80 | Ht 73.0 in | Wt 193.0 lb

## 2013-06-22 DIAGNOSIS — F259 Schizoaffective disorder, unspecified: Secondary | ICD-10-CM

## 2013-06-22 DIAGNOSIS — F1010 Alcohol abuse, uncomplicated: Secondary | ICD-10-CM

## 2013-06-22 DIAGNOSIS — F4310 Post-traumatic stress disorder, unspecified: Secondary | ICD-10-CM
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
## 2013-06-29 ENCOUNTER — Ambulatory Visit (HOSPITAL_COMMUNITY): Payer: Self-pay | Admitting: Psychiatry

## 2013-07-03 IMAGING — CT CT CERVICAL SPINE W/O CM
5 of 8 series · 13 of 33 positions shown, 14 images · non-contrast
Comparison: None

CT HEAD

CLINICAL DATA: Seizures.  Headache and neck pain.  Nosebleed.

CT HEAD WITHOUT CONTRAST
CT MAXILLOFACIAL WITHOUT CONTRAST
CT CERVICAL SPINE WITHOUT CONTRAST
TECHNIQUE: Multidetector CT imaging of the head, cervical spine,
and maxillofacial structures were performed using the standard
protocol without intravenous contrast. Multiplanar CT image
reconstructions of the cervical spine and maxillofacial structures
were also generated.

[Series 4: max soft 2.0 h31s · axial · 0.35mm/px · z∈[+4,+96]mm · 3 of 124 slices shown]
[im 31/124  soft-tissue]
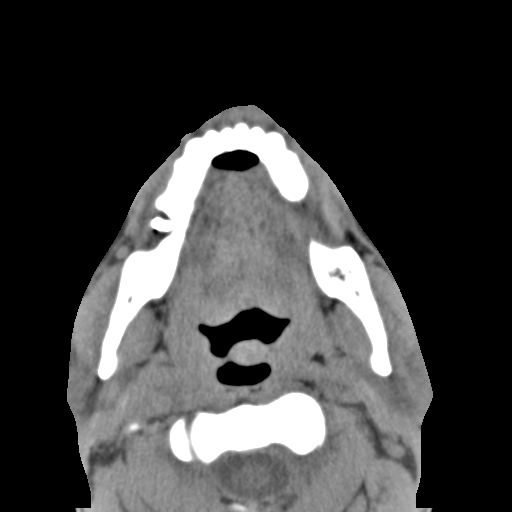
[im 62/124  soft-tissue]
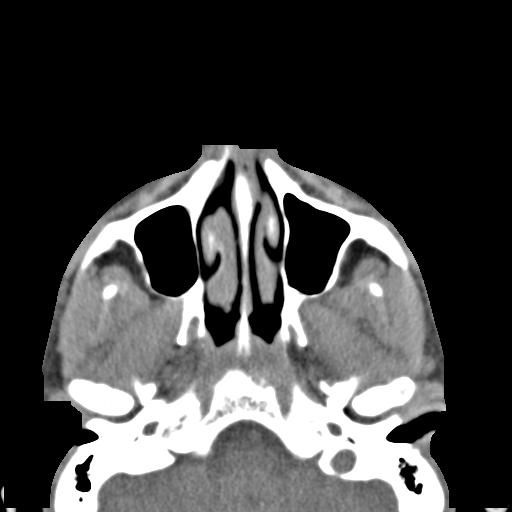
[im 93/124  soft-tissue]
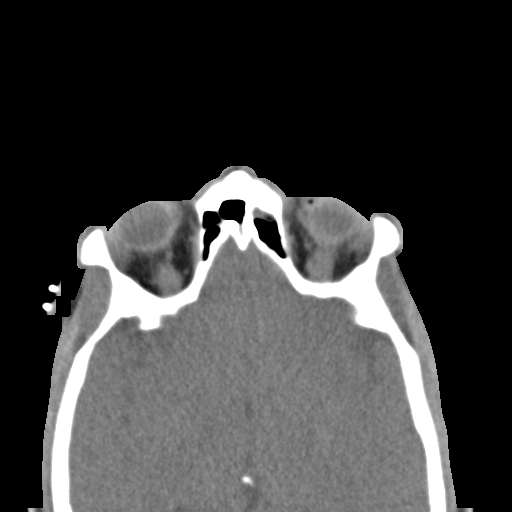

[Series 6: max st coronal · coronal · 0.37mm/px · 1 of 79 slices shown]
[im 40/79  bone]
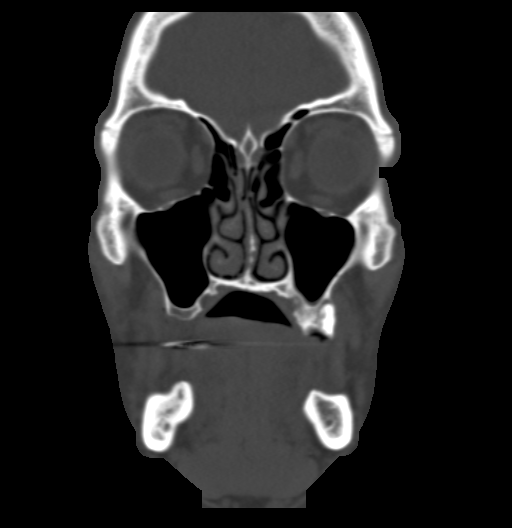

[Series 7: max st sagittal · sagittal · 0.33mm/px · 5 of 83 slices shown]
[im 12/83  bone]
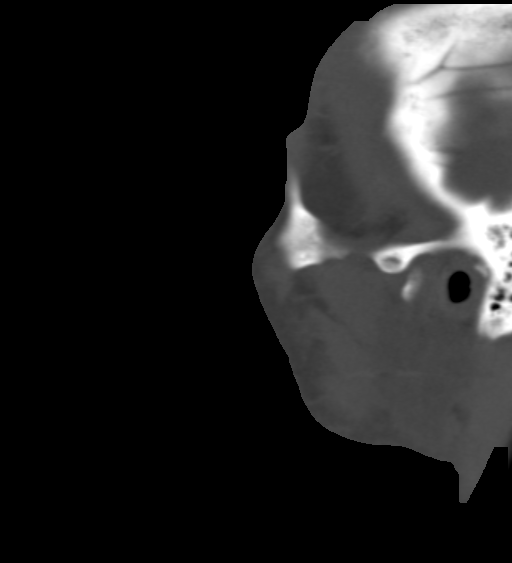
[im 24/83  bone]
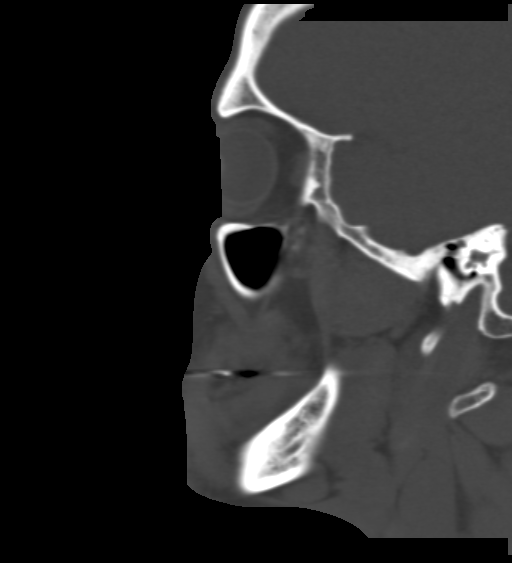
[im 36/83  bone]
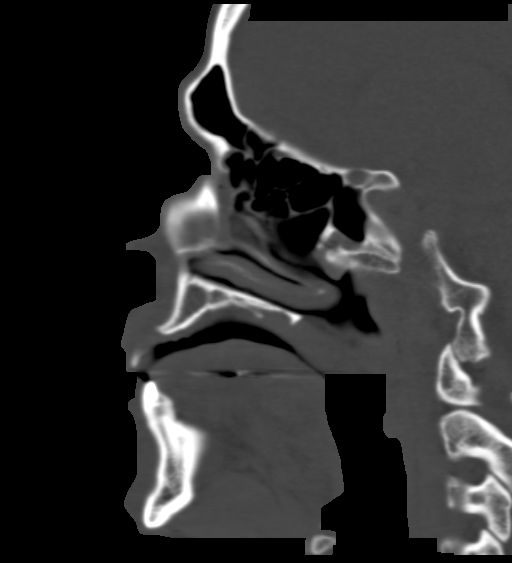
[im 47/83  bone]
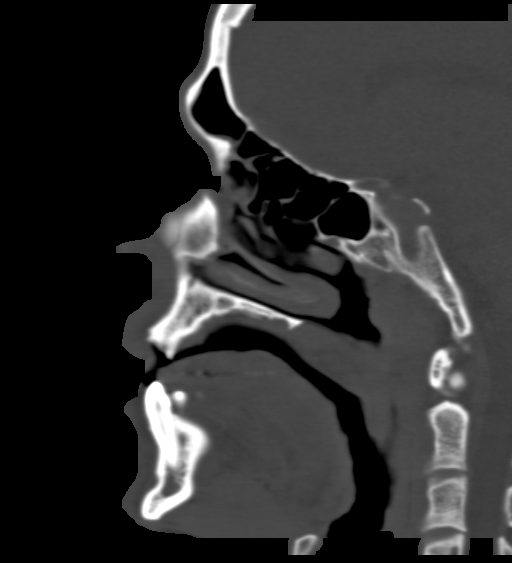
[im 59/83  bone]
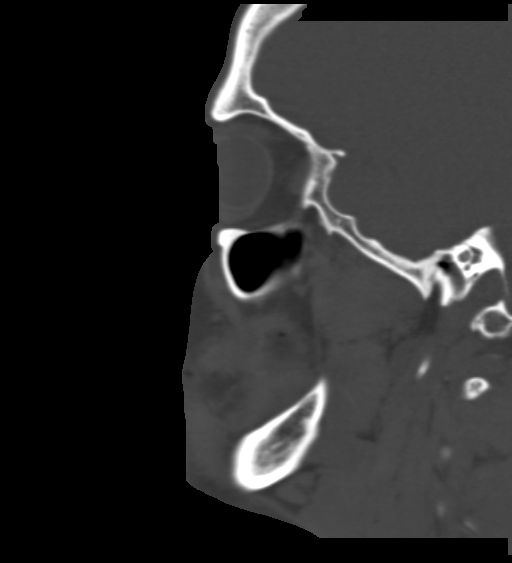

[Series 11: cervical st 2.0 b31s · axial · 0.27mm/px · z∈[-69,-1]mm · 2 of 104 slices shown, 3 images]
[im 35/104  soft-tissue]
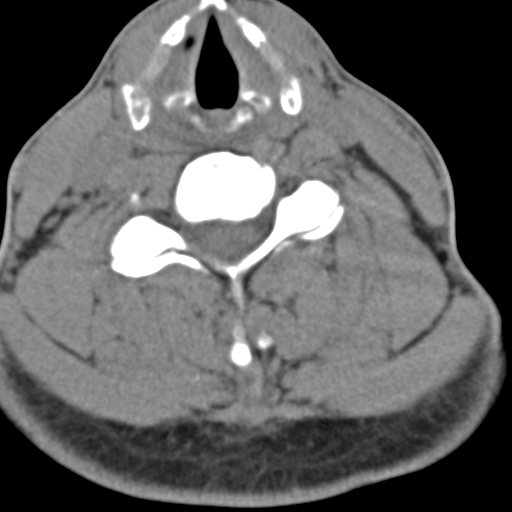
[im 35/104  bone]
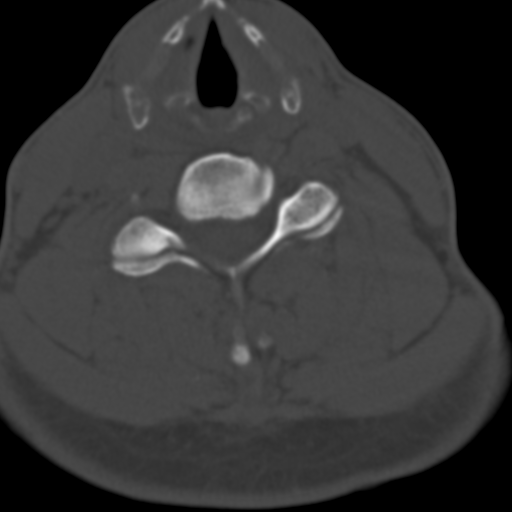
[im 69/104  bone]
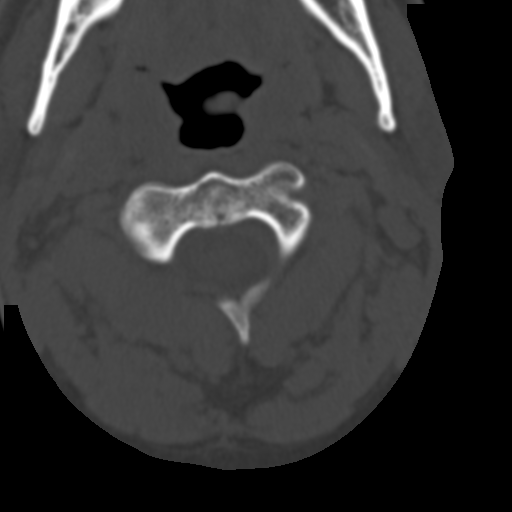

[Series 15: axial bone 2.0 · axial · 0.22mm/px · z∈[-92,-21]mm · 2 of 108 slices shown]
[im 36/108  bone]
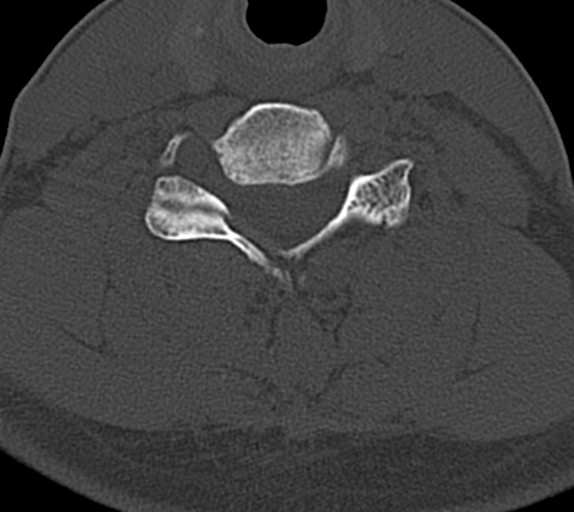
[im 72/108  bone]
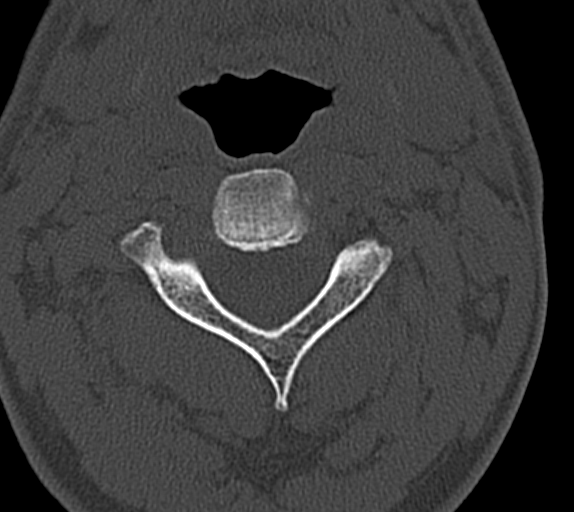

[13 of 33 positions shown; findings below may reference images not displayed]

FINDINGS: Nasal bones are fracture with minimal displacement.

No skull fracture.

No intracranial hemorrhage.

No hydrocephalus. No CT evidence of large acute infarct.  No
seizure focus identified on this unenhanced exam.  MR would prove
more sensitive for detection of such.

No intracranial mass lesion detected on this unenhanced exam..
IMPRESSION: No skull fracture or intracranial hemorrhage.

Bilateral nasal bone fracture.

CT MAXILLOFACIAL
FINDINGS: Bilateral nasal bone fracture with minimal displacement.
Anterior superior nasal septum is fractured angulated.  No other
facial fracture noted.

Caries.  Peri apical lucency noted involve several teeth.
IMPRESSION: Bilateral nasal bone fracture with minimal displacement. Anterior
superior nasal septum is fractured angulated.

No other facial fracture noted.

Caries.  Peri apical lucency noted involve several teeth.]

CT CERVICAL SPINE
FINDINGS: No cervical spine fracture or malalignment.

No abnormal prevertebral soft tissue swelling.

Minimal amount of gas anterior inferior aspect C6-7 disc space
probably related to degenerative changes.  If there is a high
clinical suspicion for ligamentous injury, flexion/extension views
or MR can be obtained for further delineation.

Cervical spondylotic changes with various degrees of spinal
stenosis and foraminal narrowing.
IMPRESSION: No cervical spine fracture or dislocation.  Please see above.

## 2013-07-19 ENCOUNTER — Ambulatory Visit (INDEPENDENT_AMBULATORY_CARE_PROVIDER_SITE_OTHER): Payer: Medicare Other | Admitting: Psychiatry

## 2013-07-19 ENCOUNTER — Encounter (HOSPITAL_COMMUNITY): Payer: Self-pay | Admitting: Psychiatry

## 2013-07-19 VITALS — BP 130/80 | Ht 73.0 in | Wt 200.0 lb

## 2013-07-19 DIAGNOSIS — T424X5A Adverse effect of benzodiazepines, initial encounter: Secondary | ICD-10-CM

## 2013-07-19 DIAGNOSIS — F1010 Alcohol abuse, uncomplicated: Secondary | ICD-10-CM

## 2013-07-19 DIAGNOSIS — F0781 Postconcussional syndrome: Secondary | ICD-10-CM

## 2013-07-19 DIAGNOSIS — F259 Schizoaffective disorder, unspecified: Secondary | ICD-10-CM

## 2013-07-19 DIAGNOSIS — F4310 Post-traumatic stress disorder, unspecified: Secondary | ICD-10-CM

## 2013-07-19 MED ORDER — GABAPENTIN 300 MG PO CAPS: 300.0000 mg | ORAL_CAPSULE | Freq: Four times a day (QID) | ORAL | Status: DC

## 2013-07-19 MED ORDER — LURASIDONE HCL 60 MG PO TABS: 80.0000 mg | ORAL_TABLET | Freq: Every day | ORAL | Status: DC

## 2013-07-19 MED ORDER — LURASIDONE HCL 80 MG PO TABS: ORAL_TABLET | ORAL | Status: DC

## 2013-07-19 MED ORDER — ALPRAZOLAM 1 MG PO TABS
1.0000 mg | ORAL_TABLET | Freq: Four times a day (QID) | ORAL | Status: DC
Start: 2013-07-19 — End: 2013-09-18

## 2013-07-19 MED ORDER — PRAZOSIN HCL 5 MG PO CAPS: 5.0000 mg | ORAL_CAPSULE | Freq: Every day | ORAL | Status: DC
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
## 2013-07-20 ENCOUNTER — Ambulatory Visit (HOSPITAL_COMMUNITY): Payer: Self-pay | Admitting: Psychology

## 2013-08-03 ENCOUNTER — Ambulatory Visit (INDEPENDENT_AMBULATORY_CARE_PROVIDER_SITE_OTHER): Payer: Medicare Other | Admitting: Psychology

## 2013-08-03 ENCOUNTER — Encounter (HOSPITAL_COMMUNITY): Payer: Self-pay | Admitting: Psychology

## 2013-08-03 DIAGNOSIS — F4310 Post-traumatic stress disorder, unspecified: Secondary | ICD-10-CM
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
## 2013-08-27 ENCOUNTER — Encounter (HOSPITAL_COMMUNITY): Payer: Self-pay | Admitting: Psychology

## 2013-08-27 ENCOUNTER — Ambulatory Visit (INDEPENDENT_AMBULATORY_CARE_PROVIDER_SITE_OTHER): Payer: Medicare Other | Admitting: Psychology

## 2013-08-27 DIAGNOSIS — F4310 Post-traumatic stress disorder, unspecified: Secondary | ICD-10-CM
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
## 2013-09-18 ENCOUNTER — Ambulatory Visit (INDEPENDENT_AMBULATORY_CARE_PROVIDER_SITE_OTHER): Payer: Medicare Other | Admitting: Psychiatry

## 2013-09-18 ENCOUNTER — Encounter (HOSPITAL_COMMUNITY): Payer: Self-pay | Admitting: Psychiatry

## 2013-09-18 VITALS — BP 130/78 | Ht 73.0 in | Wt 200.0 lb

## 2013-09-18 DIAGNOSIS — F4310 Post-traumatic stress disorder, unspecified: Secondary | ICD-10-CM

## 2013-09-18 DIAGNOSIS — F259 Schizoaffective disorder, unspecified: Secondary | ICD-10-CM

## 2013-09-18 DIAGNOSIS — F0781 Postconcussional syndrome: Secondary | ICD-10-CM

## 2013-09-18 DIAGNOSIS — F1010 Alcohol abuse, uncomplicated: Secondary | ICD-10-CM

## 2013-09-18 DIAGNOSIS — T424X5D Adverse effect of benzodiazepines, subsequent encounter: Secondary | ICD-10-CM

## 2013-09-18 MED ORDER — GABAPENTIN 300 MG PO CAPS: 300.0000 mg | ORAL_CAPSULE | Freq: Four times a day (QID) | ORAL | Status: DC

## 2013-09-18 MED ORDER — LURASIDONE HCL 80 MG PO TABS: ORAL_TABLET | ORAL | Status: DC

## 2013-09-18 MED ORDER — ALPRAZOLAM 1 MG PO TABS: 1.0000 mg | ORAL_TABLET | Freq: Four times a day (QID) | ORAL | Status: DC

## 2013-09-18 MED ORDER — DULOXETINE HCL 60 MG PO CPEP: 60.0000 mg | ORAL_CAPSULE | Freq: Every day | ORAL | Status: DC

## 2013-09-18 MED ORDER — PRAZOSIN HCL 5 MG PO CAPS: 5.0000 mg | ORAL_CAPSULE | Freq: Every day | ORAL | Status: DC
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
## 2013-09-19 ENCOUNTER — Ambulatory Visit (HOSPITAL_COMMUNITY): Payer: Self-pay | Admitting: Psychology

## 2013-10-16 ENCOUNTER — Ambulatory Visit (HOSPITAL_COMMUNITY): Payer: Self-pay | Admitting: Psychiatry

## 2013-10-16 ENCOUNTER — Ambulatory Visit (INDEPENDENT_AMBULATORY_CARE_PROVIDER_SITE_OTHER): Payer: 59 | Admitting: Psychiatry

## 2013-10-16 ENCOUNTER — Encounter (HOSPITAL_COMMUNITY): Payer: Self-pay | Admitting: Psychiatry

## 2013-10-16 VITALS — BP 130/90 | Ht 73.0 in | Wt 195.0 lb

## 2013-10-16 DIAGNOSIS — T424X5D Adverse effect of benzodiazepines, subsequent encounter: Secondary | ICD-10-CM

## 2013-10-16 DIAGNOSIS — F0781 Postconcussional syndrome: Secondary | ICD-10-CM

## 2013-10-16 DIAGNOSIS — F4310 Post-traumatic stress disorder, unspecified: Secondary | ICD-10-CM

## 2013-10-16 DIAGNOSIS — F259 Schizoaffective disorder, unspecified: Secondary | ICD-10-CM

## 2013-10-16 DIAGNOSIS — F1010 Alcohol abuse, uncomplicated: Secondary | ICD-10-CM

## 2013-10-16 MED ORDER — ALPRAZOLAM 1 MG PO TABS: 1.0000 mg | ORAL_TABLET | Freq: Four times a day (QID) | ORAL | Status: DC

## 2013-10-16 MED ORDER — GABAPENTIN 300 MG PO CAPS: 300.0000 mg | ORAL_CAPSULE | Freq: Four times a day (QID) | ORAL | Status: DC

## 2013-10-16 MED ORDER — PRAZOSIN HCL 5 MG PO CAPS: 5.0000 mg | ORAL_CAPSULE | Freq: Every day | ORAL | Status: DC

## 2013-10-16 MED ORDER — LURASIDONE HCL 80 MG PO TABS: ORAL_TABLET | ORAL | Status: DC

## 2013-10-16 MED ORDER — DULOXETINE HCL 60 MG PO CPEP: 60.0000 mg | ORAL_CAPSULE | Freq: Every day | ORAL | Status: DC
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
## 2013-10-22 ENCOUNTER — Ambulatory Visit (INDEPENDENT_AMBULATORY_CARE_PROVIDER_SITE_OTHER): Payer: Medicare Other | Admitting: Psychology

## 2013-10-22 DIAGNOSIS — F4310 Post-traumatic stress disorder, unspecified: Secondary | ICD-10-CM

## 2013-10-24 ENCOUNTER — Encounter (HOSPITAL_COMMUNITY): Payer: Self-pay | Admitting: Psychology
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
## 2013-11-22 ENCOUNTER — Ambulatory Visit (INDEPENDENT_AMBULATORY_CARE_PROVIDER_SITE_OTHER): Payer: 59 | Admitting: Psychology

## 2013-11-22 DIAGNOSIS — F0781 Postconcussional syndrome: Secondary | ICD-10-CM

## 2013-11-22 DIAGNOSIS — F4310 Post-traumatic stress disorder, unspecified: Secondary | ICD-10-CM

## 2013-12-14 ENCOUNTER — Ambulatory Visit (INDEPENDENT_AMBULATORY_CARE_PROVIDER_SITE_OTHER): Payer: 59 | Admitting: Psychiatry

## 2013-12-14 ENCOUNTER — Encounter (HOSPITAL_COMMUNITY): Payer: Self-pay | Admitting: Psychiatry

## 2013-12-14 VITALS — BP 138/86 | HR 127 | Ht 73.0 in | Wt 202.2 lb

## 2013-12-14 DIAGNOSIS — F0781 Postconcussional syndrome: Secondary | ICD-10-CM

## 2013-12-14 DIAGNOSIS — F1010 Alcohol abuse, uncomplicated: Secondary | ICD-10-CM

## 2013-12-14 DIAGNOSIS — F259 Schizoaffective disorder, unspecified: Secondary | ICD-10-CM

## 2013-12-14 DIAGNOSIS — T424X5D Adverse effect of benzodiazepines, subsequent encounter: Secondary | ICD-10-CM

## 2013-12-14 DIAGNOSIS — F4310 Post-traumatic stress disorder, unspecified: Secondary | ICD-10-CM

## 2013-12-14 MED ORDER — GABAPENTIN 300 MG PO CAPS: 300.0000 mg | ORAL_CAPSULE | Freq: Four times a day (QID) | ORAL | Status: DC

## 2013-12-14 MED ORDER — DULOXETINE HCL 60 MG PO CPEP: 60.0000 mg | ORAL_CAPSULE | Freq: Every day | ORAL | Status: DC

## 2013-12-14 MED ORDER — LURASIDONE HCL 80 MG PO TABS: ORAL_TABLET | ORAL | Status: DC

## 2013-12-14 MED ORDER — ALPRAZOLAM 1 MG PO TABS: 1.0000 mg | ORAL_TABLET | Freq: Four times a day (QID) | ORAL | Status: DC

## 2013-12-14 MED ORDER — PRAZOSIN HCL 5 MG PO CAPS: 5.0000 mg | ORAL_CAPSULE | Freq: Every day | ORAL | Status: DC
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
## 2013-12-21 ENCOUNTER — Ambulatory Visit (HOSPITAL_COMMUNITY): Payer: Self-pay | Admitting: Psychology

## 2014-01-05 ENCOUNTER — Other Ambulatory Visit (HOSPITAL_COMMUNITY): Payer: Self-pay | Admitting: Psychiatry

## 2014-01-09 ENCOUNTER — Ambulatory Visit (INDEPENDENT_AMBULATORY_CARE_PROVIDER_SITE_OTHER): Payer: 59 | Admitting: Psychology

## 2014-01-09 ENCOUNTER — Encounter (HOSPITAL_COMMUNITY): Payer: Self-pay | Admitting: Psychology

## 2014-01-09 DIAGNOSIS — F4310 Post-traumatic stress disorder, unspecified: Secondary | ICD-10-CM
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
## 2014-01-10 ENCOUNTER — Encounter (HOSPITAL_COMMUNITY): Payer: Self-pay | Admitting: Psychology
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
## 2014-01-17 ENCOUNTER — Telehealth (HOSPITAL_COMMUNITY): Payer: Self-pay | Admitting: *Deleted

## 2014-01-17 ENCOUNTER — Other Ambulatory Visit: Payer: Self-pay | Admitting: Psychiatry

## 2014-01-17 ENCOUNTER — Encounter (HOSPITAL_COMMUNITY): Payer: Self-pay | Admitting: Emergency Medicine

## 2014-01-17 ENCOUNTER — Emergency Department (HOSPITAL_COMMUNITY)
Admission: EM | Admit: 2014-01-17 | Discharge: 2014-01-18 | Disposition: A | Discharge status: Other Acute Inpatient Hospital | Payer: Medicare Other | Attending: Emergency Medicine | Admitting: Emergency Medicine

## 2014-01-17 DIAGNOSIS — N189 Chronic kidney disease, unspecified: Secondary | ICD-10-CM | POA: Diagnosis not present

## 2014-01-17 DIAGNOSIS — Z79899 Other long term (current) drug therapy: Secondary | ICD-10-CM | POA: Diagnosis not present

## 2014-01-17 DIAGNOSIS — G40909 Epilepsy, unspecified, not intractable, without status epilepticus: Secondary | ICD-10-CM | POA: Diagnosis not present

## 2014-01-17 DIAGNOSIS — Z046 Encounter for general psychiatric examination, requested by authority: Secondary | ICD-10-CM | POA: Diagnosis present

## 2014-01-17 DIAGNOSIS — I129 Hypertensive chronic kidney disease with stage 1 through stage 4 chronic kidney disease, or unspecified chronic kidney disease: Secondary | ICD-10-CM | POA: Diagnosis not present

## 2014-01-17 DIAGNOSIS — F1010 Alcohol abuse, uncomplicated: Secondary | ICD-10-CM

## 2014-01-17 DIAGNOSIS — Z880 Allergy status to penicillin: Secondary | ICD-10-CM | POA: Diagnosis not present

## 2014-01-17 DIAGNOSIS — R45851 Suicidal ideations: Secondary | ICD-10-CM | POA: Insufficient documentation

## 2014-01-17 DIAGNOSIS — Z720 Tobacco use: Secondary | ICD-10-CM | POA: Diagnosis not present

## 2014-01-17 DIAGNOSIS — G479 Sleep disorder, unspecified: Secondary | ICD-10-CM | POA: Insufficient documentation

## 2014-01-17 LAB — CBC WITH DIFFERENTIAL/PLATELET
Basophils Absolute: 0 10*3/uL (ref 0.0–0.1)
Basophils Relative: 0 % (ref 0–1)
Eosinophils Absolute: 0.3 10*3/uL (ref 0.0–0.7)
Eosinophils Relative: 5 % (ref 0–5)
HCT: 45.8 % (ref 39.0–52.0)
Hemoglobin: 16.4 g/dL (ref 13.0–17.0)
Lymphocytes Relative: 39 % (ref 12–46)
Lymphs Abs: 2.8 10*3/uL (ref 0.7–4.0)
MCH: 31.6 pg (ref 26.0–34.0)
MCHC: 35.8 g/dL (ref 30.0–36.0)
MCV: 88.2 fL (ref 78.0–100.0)
Monocytes Absolute: 0.4 10*3/uL (ref 0.1–1.0)
Monocytes Relative: 6 % (ref 3–12)
Neutro Abs: 3.6 10*3/uL (ref 1.7–7.7)
Neutrophils Relative %: 50 % (ref 43–77)
Platelets: 228 10*3/uL (ref 150–400)
RBC: 5.19 MIL/uL (ref 4.22–5.81)
RDW: 13 % (ref 11.5–15.5)
WBC: 7.2 10*3/uL (ref 4.0–10.5)

## 2014-01-17 LAB — ETHANOL: Alcohol, Ethyl (B): 101 mg/dL — ABNORMAL HIGH (ref 0–11)

## 2014-01-17 LAB — BASIC METABOLIC PANEL
Anion gap: 15 (ref 5–15)
BUN: 7 mg/dL (ref 6–23)
CO2: 23 mEq/L (ref 19–32)
Calcium: 9.9 mg/dL (ref 8.4–10.5)
Chloride: 102 mEq/L (ref 96–112)
Creatinine, Ser: 0.81 mg/dL (ref 0.50–1.35)
GFR calc Af Amer: >90 mL/min (ref >90)
GFR calc non Af Amer: >90 mL/min (ref >90)
Glucose, Bld: 93 mg/dL (ref 70–99)
Potassium: 4 mEq/L (ref 3.7–5.3)
Sodium: 140 mEq/L (ref 137–147)

## 2014-01-17 LAB — RAPID URINE DRUG SCREEN, HOSP PERFORMED
Amphetamines: NOT DETECTED
Barbiturates: NOT DETECTED
Benzodiazepines: NOT DETECTED
Cocaine: NOT DETECTED
Opiates: NOT DETECTED
Tetrahydrocannabinol: NOT DETECTED

## 2014-01-17 MED ORDER — LEVETIRACETAM 500 MG PO TABS
500.0000 mg | ORAL_TABLET | Freq: Two times a day (BID) | ORAL | Status: DC
  Administered 2014-01-17: 500 mg via ORAL
  Filled 2014-01-17: qty 1

## 2014-01-17 MED ORDER — LISINOPRIL 10 MG PO TABS
10.0000 mg | ORAL_TABLET | Freq: Every day | ORAL | Status: DC
  Administered 2014-01-17: 10 mg via ORAL
  Filled 2014-01-17: qty 1

## 2014-01-17 MED ORDER — PRAZOSIN HCL 5 MG PO CAPS
ORAL_CAPSULE | ORAL | Status: AC
  Filled 2014-01-17: qty 1

## 2014-01-17 MED ORDER — GABAPENTIN 300 MG PO CAPS
300.0000 mg | ORAL_CAPSULE | Freq: Four times a day (QID) | ORAL | Status: DC
  Administered 2014-01-17: 300 mg via ORAL
  Filled 2014-01-17: qty 1

## 2014-01-17 MED ORDER — LURASIDONE HCL 80 MG PO TABS
80.0000 mg | ORAL_TABLET | Freq: Every day | ORAL | Status: DC
  Administered 2014-01-17: 80 mg via ORAL
  Filled 2014-01-17 (×2): qty 1

## 2014-01-17 MED ORDER — LORAZEPAM 1 MG PO TABS: 0.0000 mg | ORAL_TABLET | Freq: Two times a day (BID) | ORAL | Status: DC

## 2014-01-17 MED ORDER — ONDANSETRON 4 MG PO TBDP
ORAL_TABLET | ORAL | Status: AC
  Filled 2014-01-17: qty 1

## 2014-01-17 MED ORDER — DULOXETINE HCL 20 MG PO CPEP
60.0000 mg | ORAL_CAPSULE | Freq: Every day | ORAL | Status: DC
  Administered 2014-01-17: 60 mg via ORAL
  Filled 2014-01-17: qty 3

## 2014-01-17 MED ORDER — LORAZEPAM 1 MG PO TABS
0.0000 mg | ORAL_TABLET | Freq: Four times a day (QID) | ORAL | Status: DC
  Administered 2014-01-17: 1 mg via ORAL
  Filled 2014-01-17: qty 1

## 2014-01-17 MED ORDER — ALPRAZOLAM 0.5 MG PO TABS
1.0000 mg | ORAL_TABLET | Freq: Four times a day (QID) | ORAL | Status: DC
  Administered 2014-01-17: 1 mg via ORAL
  Filled 2014-01-17: qty 2

## 2014-01-17 MED ORDER — PRAZOSIN HCL 5 MG PO CAPS
5.0000 mg | ORAL_CAPSULE | Freq: Every day | ORAL | Status: DC
  Administered 2014-01-17: 5 mg via ORAL
  Filled 2014-01-17 (×2): qty 1

## 2014-01-17 MED ORDER — ONDANSETRON 4 MG PO TBDP
4.0000 mg | ORAL_TABLET | Freq: Once | ORAL | Status: AC
  Administered 2014-01-17: 4 mg via ORAL

## 2014-01-17 NOTE — BH Assessment (Signed)
Relayed results of assessment to Jamison Lord, NP. Per Molli KnockJ. Lord pt meets inpt criteria and [facility]   Per [facility] MeansndianaC Binnie RailJoann Glover, RN, pt can been placed in room 500-1 under the care of Dr. Elna BreslowEappen and can arrive after 11 pm. Report can be called to [phone].   Informed EDP of recommendations and he is in agreement and will complete the transfer.   Informed RN of plan. She will inform pt and obtain support paper work (voluntarty consent for treatment and ROI) to be faxed to [phone] and originals to be sent with pt transport.  Informed tech of pt acceptance.   Clista BernhardtNancy Azuree Minish, [facility] Triage Specialist 01/17/2014 10:03 PM

## 2014-01-17 NOTE — Telephone Encounter (Signed)
Spoke to patient. He is drinking a feels suicidal--plan to take overdose of blood pressure meds. he agrees to give meds to wife who is with him and [facility]. CMA instructed to inform ER
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
## 2014-01-17 NOTE — BH Assessment (Signed)
Reviewed notes prior to initiating assessment.   Requested cart be placed with pt for assessment.    Clista BernhardtNancy Darly Fails, [facility] 01/17/2014 9:23 PM
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
## 2014-01-17 NOTE — Telephone Encounter (Signed)
[facility] and spoke with Satira MccallumJ.J Cruz. Per Satira MccallumJ.J Cruz she will be looking out for pt.

## 2014-01-17 NOTE — Telephone Encounter (Signed)
Pt calling stating he started drinking again and he need to come in to see Dr. Tenny Crawoss sooner then scheduled appt. Per pt he is not suicidal to himself or harming others right now but at times he do have those feelings and thoughts and just need to come in to see Dr. Tenny Crawoss sooner then 03-15-2014. Pt address is [address], [place]. Pt number is [phone]. Informed pt that I will inform Dr. Tenny Crawoss of this and wither her or I will call him back.

## 2014-01-17 NOTE — ED Provider Notes (Signed)
CSN: [id number]     Arrival date & time 01/17/14  1631 History   First MD Initiated Contact with Patient 01/17/14 1647     Chief Complaint  Patient presents with  . V70.1     (Consider location/radiation/quality/duration/timing/severity/associated sxs/prior Treatment) HPI Patient presents with a concern for increasingly frequent thoughts of self-harm, ongoing alcohol abuse. Patient has a long history of alcohol abuse, with recent decrease, both no success with cessation. Currently the patient streaking approximately 3 beers each day. Patient notes that he is recently became more depressed, with increasing thoughts of killing himself, taking all medication at once. Patient has multiple medical problems, including PTSD, anxiety, hepatitis, schizoaffective disorder. Patient is here with his wife, who assists with the history of present illness.  Past Medical History  Diagnosis Date  . ADHD (attention deficit hyperactivity disorder)   . Bipolar disorder   . Anxiety   . PTSD (post-traumatic stress disorder)   . Hepatitis C antibody test positive   . Headache(784.0)   . Seizures   . Hypertension   . Schizoaffective disorder   . Chronic kidney disease    Past Surgical History  Procedure Laterality Date  . Renal artery stent     Family History  Problem Relation Age of Onset  . Alcohol abuse Father   . Drug abuse Mother   . Anxiety disorder Sister   . Drug abuse Sister    History  Substance Use Topics  . Smoking status: Current Every Day Smoker -- 0.50 packs/day    Types: Cigarettes    Start date: 03/29/1990  . Smokeless tobacco: Former NeurosurgeonUser    Types: Chew     Comment: Agrees to start 14mg   . Alcohol Use: 7.5 oz/week    0 Cans of beer, 15 Drinks containing 0.5 oz of alcohol per week     Comment: drinks everyday    Review of Systems  Constitutional:       Per HPI, otherwise negative  HENT:       Per HPI, otherwise negative  Respiratory:       Per HPI, otherwise  negative  Cardiovascular:       Per HPI, otherwise negative  Gastrointestinal: Negative for vomiting.  Endocrine:       Negative aside from HPI  Genitourinary:       Neg aside from HPI   Musculoskeletal:       Per HPI, otherwise negative  Skin: Negative.   Neurological: Negative for syncope.  Psychiatric/Behavioral: Positive for suicidal ideas, hallucinations, sleep disturbance and dysphoric mood. The patient is nervous/anxious.       Allergies  Asa; Benzodiazepines; Penicillins; Tomato; and Ultram  Home Medications   Prior to Admission medications   Medication Sig Start Date End Date Taking? Authorizing Provider  ALPRAZolam Prudy Feeler(XANAX) 1 MG tablet Take 1 tablet (1 mg total) by mouth 4 (four) times daily. 12/14/13  Yes Diannia Rudereborah Ross, MD  DULoxetine (CYMBALTA) 60 MG capsule Take 1 capsule (60 mg total) by mouth daily. 12/14/13 12/14/14 Yes Diannia Rudereborah Ross, MD  gabapentin (NEURONTIN) 300 MG capsule Take 1 capsule (300 mg total) by mouth 4 (four) times daily. 12/14/13 12/14/14 Yes Diannia Rudereborah Ross, MD  lisinopril (PRINIVIL,ZESTRIL) 10 MG tablet Take 10 mg by mouth daily.   Yes Historical Provider, MD  lurasidone (LATUDA) 80 MG TABS tablet Take 80 mg by mouth at bedtime.   Yes Historical Provider, MD  prazosin (MINIPRESS) 5 MG capsule Take 1 capsule (5 mg total) by mouth at bedtime. 12/14/13  Yes Diannia Rudereborah Ross, MD  levETIRAcetam (KEPPRA) 500 MG tablet Take 1 tablet (500 mg total) by mouth every 12 (twelve) hours. 04/30/12   Glynn OctaveStephen Rancour, MD   BP 133/78  Pulse 95  Temp(Src) 97.7 F (36.5 C) (Oral)  Resp 20  Ht 6\' 2"  (1.88 m)  Wt 202 lb (91.627 kg)  BMI 25.92 kg/m2  SpO2 98% Physical Exam  Nursing note and vitals reviewed. Constitutional: He is oriented to person, place, and time. He appears well-developed. No distress.  HENT:  Head: Normocephalic and atraumatic.  Eyes: Conjunctivae and EOM are normal.  Cardiovascular: Normal rate and regular rhythm.   Pulmonary/Chest: Effort normal. No  stridor. No respiratory distress.  Abdominal: He exhibits no distension.  Musculoskeletal: He exhibits no edema.  Neurological: He is alert and oriented to person, place, and time.  Skin: Skin is warm and dry.  Psychiatric: His mood appears anxious. His affect is blunt. His speech is delayed. He expresses suicidal ideation. He expresses no homicidal ideation. He expresses suicidal plans. He expresses no homicidal plans.    ED Course  Procedures (including critical care time)  I reviewed the EMR, and all labs from this visit.  MDM   This patient p/w SI, etoh abuse. Patient is awake and alert, in no distress, interacting appropriately.  Patient's labs    an are unremarkable, was hemodynamically stable, and he was cleared for psychiatry evaluation  Gerhard Munchobert Airica Schwartzkopf, MD 01/17/14 2109

## 2014-01-17 NOTE — ED Notes (Signed)
PT came to ED today for alcohol abuse and SI. PT states he had a plan to overdose on his home medications. PT states he wants rehab for alcohol abuse.

## 2014-01-18 ENCOUNTER — Encounter (HOSPITAL_COMMUNITY): Payer: Self-pay

## 2014-01-18 ENCOUNTER — Inpatient Hospital Stay (HOSPITAL_COMMUNITY)
Admission: EM | Admit: 2014-01-18 | Discharge: 2014-01-21 | DRG: 897 | Disposition: A | Discharge status: Home / Self Care | Payer: 59 | Source: Intra-hospital | Attending: Psychiatry | Admitting: Psychiatry

## 2014-01-18 DIAGNOSIS — R45851 Suicidal ideations: Secondary | ICD-10-CM | POA: Diagnosis present

## 2014-01-18 DIAGNOSIS — Z599 Problem related to housing and economic circumstances, unspecified: Secondary | ICD-10-CM | POA: Diagnosis not present

## 2014-01-18 DIAGNOSIS — F10239 Alcohol dependence with withdrawal, unspecified: Secondary | ICD-10-CM

## 2014-01-18 DIAGNOSIS — F1020 Alcohol dependence, uncomplicated: Secondary | ICD-10-CM | POA: Diagnosis present

## 2014-01-18 DIAGNOSIS — F909 Attention-deficit hyperactivity disorder, unspecified type: Secondary | ICD-10-CM | POA: Diagnosis present

## 2014-01-18 DIAGNOSIS — F10230 Alcohol dependence with withdrawal, uncomplicated: Secondary | ICD-10-CM

## 2014-01-18 DIAGNOSIS — F259 Schizoaffective disorder, unspecified: Secondary | ICD-10-CM | POA: Diagnosis present

## 2014-01-18 DIAGNOSIS — F4310 Post-traumatic stress disorder, unspecified: Secondary | ICD-10-CM | POA: Diagnosis present

## 2014-01-18 DIAGNOSIS — F1290 Cannabis use, unspecified, uncomplicated: Secondary | ICD-10-CM | POA: Diagnosis present

## 2014-01-18 DIAGNOSIS — F19239 Other psychoactive substance dependence with withdrawal, unspecified: Secondary | ICD-10-CM

## 2014-01-18 DIAGNOSIS — F19980 Other psychoactive substance use, unspecified with psychoactive substance-induced anxiety disorder: Secondary | ICD-10-CM

## 2014-01-18 DIAGNOSIS — B1920 Unspecified viral hepatitis C without hepatic coma: Secondary | ICD-10-CM | POA: Diagnosis present

## 2014-01-18 DIAGNOSIS — Z609 Problem related to social environment, unspecified: Secondary | ICD-10-CM | POA: Diagnosis present

## 2014-01-18 DIAGNOSIS — G4700 Insomnia, unspecified: Secondary | ICD-10-CM | POA: Diagnosis present

## 2014-01-18 DIAGNOSIS — Z559 Problems related to education and literacy, unspecified: Secondary | ICD-10-CM | POA: Diagnosis present

## 2014-01-18 DIAGNOSIS — F1024 Alcohol dependence with alcohol-induced mood disorder: Secondary | ICD-10-CM

## 2014-01-18 DIAGNOSIS — F332 Major depressive disorder, recurrent severe without psychotic features: Secondary | ICD-10-CM | POA: Diagnosis present

## 2014-01-18 DIAGNOSIS — F10930 Alcohol use, unspecified with withdrawal, uncomplicated: Secondary | ICD-10-CM

## 2014-01-18 DIAGNOSIS — F1010 Alcohol abuse, uncomplicated: Secondary | ICD-10-CM | POA: Diagnosis present

## 2014-01-18 DIAGNOSIS — I129 Hypertensive chronic kidney disease with stage 1 through stage 4 chronic kidney disease, or unspecified chronic kidney disease: Secondary | ICD-10-CM | POA: Diagnosis present

## 2014-01-18 DIAGNOSIS — N189 Chronic kidney disease, unspecified: Secondary | ICD-10-CM | POA: Diagnosis present

## 2014-01-18 DIAGNOSIS — F17210 Nicotine dependence, cigarettes, uncomplicated: Secondary | ICD-10-CM | POA: Diagnosis present

## 2014-01-18 DIAGNOSIS — F10939 Alcohol use, unspecified with withdrawal, unspecified: Secondary | ICD-10-CM

## 2014-01-18 DIAGNOSIS — F10259 Alcohol dependence with alcohol-induced psychotic disorder, unspecified: Secondary | ICD-10-CM

## 2014-01-18 DIAGNOSIS — Z23 Encounter for immunization: Secondary | ICD-10-CM | POA: Diagnosis not present

## 2014-01-18 DIAGNOSIS — F3289 Other specified depressive episodes: Secondary | ICD-10-CM

## 2014-01-18 DIAGNOSIS — F17220 Nicotine dependence, chewing tobacco, uncomplicated: Secondary | ICD-10-CM | POA: Diagnosis present

## 2014-01-18 DIAGNOSIS — F1019 Alcohol abuse with unspecified alcohol-induced disorder: Secondary | ICD-10-CM

## 2014-01-18 DIAGNOSIS — G40909 Epilepsy, unspecified, not intractable, without status epilepticus: Secondary | ICD-10-CM | POA: Diagnosis present

## 2014-01-18 DIAGNOSIS — F411 Generalized anxiety disorder: Secondary | ICD-10-CM | POA: Diagnosis present

## 2014-01-18 DIAGNOSIS — F19280 Other psychoactive substance dependence with psychoactive substance-induced anxiety disorder: Secondary | ICD-10-CM

## 2014-01-18 LAB — ALT: ALT: 31 U/L (ref 0–53)

## 2014-01-18 LAB — AST: AST: 20 U/L (ref 0–37)

## 2014-01-18 LAB — TSH: TSH: 1.299 u[IU]/mL (ref 0.350–4.500)

## 2014-01-18 MED ORDER — PRAZOSIN HCL 5 MG PO CAPS
5.0000 mg | ORAL_CAPSULE | Freq: Every day | ORAL | Status: DC
  Administered 2014-01-18 – 2014-01-20 (×3): 5 mg via ORAL
  Filled 2014-01-18 (×6): qty 1

## 2014-01-18 MED ORDER — CHLORDIAZEPOXIDE HCL 25 MG PO CAPS
25.0000 mg | ORAL_CAPSULE | Freq: Four times a day (QID) | ORAL | Status: AC
  Administered 2014-01-18 (×4): 25 mg via ORAL
  Filled 2014-01-18 (×4): qty 1

## 2014-01-18 MED ORDER — HYDROXYZINE HCL 25 MG PO TABS
25.0000 mg | ORAL_TABLET | Freq: Four times a day (QID) | ORAL | Status: AC | PRN
  Administered 2014-01-19 – 2014-01-20 (×2): 25 mg via ORAL
  Filled 2014-01-18 (×3): qty 1

## 2014-01-18 MED ORDER — DULOXETINE HCL 60 MG PO CPEP
60.0000 mg | ORAL_CAPSULE | Freq: Every day | ORAL | Status: DC
  Administered 2014-01-18 – 2014-01-21 (×4): 60 mg via ORAL
  Filled 2014-01-18 (×6): qty 1

## 2014-01-18 MED ORDER — CHLORDIAZEPOXIDE HCL 25 MG PO CAPS
25.0000 mg | ORAL_CAPSULE | ORAL | Status: AC
  Administered 2014-01-20 (×2): 25 mg via ORAL
  Filled 2014-01-18 (×2): qty 1

## 2014-01-18 MED ORDER — THIAMINE HCL 100 MG/ML IJ SOLN: 100.0000 mg | Freq: Once | INTRAMUSCULAR | Status: DC

## 2014-01-18 MED ORDER — LOPERAMIDE HCL 2 MG PO CAPS: 2.0000 mg | ORAL_CAPSULE | ORAL | Status: AC | PRN

## 2014-01-18 MED ORDER — VITAMIN B-1 100 MG PO TABS
100.0000 mg | ORAL_TABLET | Freq: Every day | ORAL | Status: DC
  Administered 2014-01-19 – 2014-01-21 (×3): 100 mg via ORAL
  Filled 2014-01-18 (×5): qty 1

## 2014-01-18 MED ORDER — CHLORDIAZEPOXIDE HCL 25 MG PO CAPS
25.0000 mg | ORAL_CAPSULE | Freq: Four times a day (QID) | ORAL | Status: AC | PRN
  Administered 2014-01-20: 25 mg via ORAL

## 2014-01-18 MED ORDER — LEVETIRACETAM IN NACL 500 MG/100ML IV SOLN: 500.0000 mg | Freq: Two times a day (BID) | INTRAVENOUS | Status: DC

## 2014-01-18 MED ORDER — LISINOPRIL 10 MG PO TABS
10.0000 mg | ORAL_TABLET | Freq: Every day | ORAL | Status: DC
  Administered 2014-01-19 – 2014-01-21 (×3): 10 mg via ORAL
  Filled 2014-01-18 (×5): qty 1

## 2014-01-18 MED ORDER — ALUM & MAG HYDROXIDE-SIMETH 200-200-20 MG/5ML PO SUSP
30.0000 mL | ORAL | Status: DC | PRN
Start: 2014-01-18 — End: 2014-01-21

## 2014-01-18 MED ORDER — TRAZODONE HCL 100 MG PO TABS
100.0000 mg | ORAL_TABLET | Freq: Every evening | ORAL | Status: DC | PRN
  Administered 2014-01-19 – 2014-01-20 (×2): 100 mg via ORAL
  Filled 2014-01-18: qty 3
  Filled 2014-01-18 (×2): qty 1

## 2014-01-18 MED ORDER — CHLORDIAZEPOXIDE HCL 25 MG PO CAPS
25.0000 mg | ORAL_CAPSULE | Freq: Every day | ORAL | Status: AC
  Administered 2014-01-21: 25 mg via ORAL
  Filled 2014-01-18 (×2): qty 1

## 2014-01-18 MED ORDER — ADULT MULTIVITAMIN W/MINERALS CH
1.0000 | ORAL_TABLET | Freq: Every day | ORAL | Status: DC
  Administered 2014-01-18 – 2014-01-21 (×4): 1 via ORAL
  Filled 2014-01-18 (×7): qty 1

## 2014-01-18 MED ORDER — ONDANSETRON 4 MG PO TBDP
4.0000 mg | ORAL_TABLET | Freq: Four times a day (QID) | ORAL | Status: AC | PRN
  Administered 2014-01-18 – 2014-01-19 (×3): 4 mg via ORAL
  Filled 2014-01-18 (×3): qty 1

## 2014-01-18 MED ORDER — ACETAMINOPHEN 325 MG PO TABS: 650.0000 mg | ORAL_TABLET | Freq: Four times a day (QID) | ORAL | Status: DC | PRN

## 2014-01-18 MED ORDER — MAGNESIUM HYDROXIDE 400 MG/5ML PO SUSP: 30.0000 mL | Freq: Every day | ORAL | Status: DC | PRN

## 2014-01-18 MED ORDER — LEVETIRACETAM 500 MG PO TABS
500.0000 mg | ORAL_TABLET | Freq: Two times a day (BID) | ORAL | Status: DC
  Administered 2014-01-18 – 2014-01-21 (×7): 500 mg via ORAL
  Filled 2014-01-18 (×11): qty 1

## 2014-01-18 MED ORDER — TRAZODONE HCL 50 MG PO TABS: 50.0000 mg | ORAL_TABLET | Freq: Every evening | ORAL | Status: DC | PRN

## 2014-01-18 MED ORDER — CHLORDIAZEPOXIDE HCL 25 MG PO CAPS
25.0000 mg | ORAL_CAPSULE | Freq: Three times a day (TID) | ORAL | Status: AC
  Administered 2014-01-19 (×3): 25 mg via ORAL
  Filled 2014-01-18 (×3): qty 1

## 2014-01-18 MED ORDER — GABAPENTIN 300 MG PO CAPS
300.0000 mg | ORAL_CAPSULE | Freq: Four times a day (QID) | ORAL | Status: DC
  Administered 2014-01-18 – 2014-01-21 (×12): 300 mg via ORAL
  Filled 2014-01-18 (×21): qty 1

## 2014-01-18 NOTE — [facility] Group Notes (Signed)
[facility] L[facility] After[facility] Discharge Planning [facility] Note   01/18/2014 1:33 PM  Participation Quality:  Did not attend.  Was being interviewed by PA.    Daryel GeraldNorth, Patte Winkel B

## 2014-01-18 NOTE — Tx Team (Signed)
  Interdisciplinary Treatment Plan Update   Date Reviewed:  01/18/2014  Time Reviewed:  1:44 PM  Progress in Treatment:   Attending groups: Yes Participating in groups: Yes Taking medication as prescribed: Yes  Tolerating medication: Yes Family/Significant other contact made: No  Patient understands diagnosis: Yes AEB asking for help with detox from alcohol Discussing patient identified problems/goals with staff: Yes  See initial care plan Medical problems stabilized or resolved: Yes Denies suicidal/homicidal ideation: Yes  In tx team Patient has not harmed self or others: Yes  For review of initial/current patient goals, please see plan of care.  Estimated Length of Stay:  4-5 days  Reason for Continuation of Hospitalization: Depression Medication stabilization Withdrawal symptoms  New Problems/Goals identified:  N/A  Discharge Plan or Barriers:   return home, follow up outpt  Additional Comments: Pt reports he has been having SI on and off for the past 1-2 months, of note he has tried to decrease his drinking the past 1.5 months. Pt denies HI, denies current self-harm, and other substance use. Pt reports he has a/v hallucinations, seeing someone in the woods behind his home, sometimes with command to kill himself, the last time being a month ago. Pt reports he has longstanding depression and that it is currently the same as usual. He reports recent decreased grooming, loss of appetite, decreased sleep, SI with plan to OD, irritability, isolating himself, feeling hopeless, and worthless. States he was trying to wean himself off of alcohol, but has been unsuccessful, and has grown increasingly depressed.  Is not asking for rehab.  States he will remain sober by going to [employer] meetings in [place] and getting a sponsor.    Attendees:  Signature: Ivin BootySarama Eappen, MD 01/18/2014 1:44 PM   Signature: Richelle Itood Tareka Jhaveri, LCSW 01/18/2014 1:44 PM  Signature:  01/18/2014 1:44 PM  Signature:   01/18/2014 1:44 PM  Signature: Rodman KeyJanet Webb, RN  01/18/2014 1:44 PM  Signature:  01/18/2014 1:44 PM  Signature:   01/18/2014 1:44 PM  Signature:    Signature:    Signature:    Signature:    Signature:    Signature:      Scribe for Treatment Team:   Richelle Itood Shoshana Johal, LCSW  01/18/2014 1:44 PM

## 2014-01-18 NOTE — Tx Team (Signed)
Initial Interdisciplinary Treatment Plan   PATIENT STRESSORS: Substance abuse   PROBLEM LIST: Problem List/Patient Goals Date to be addressed Date deferred Reason deferred Estimated date of resolution  ETOH abuse 01/18/2014     depression 01/18/2014                                                DISCHARGE CRITERIA:  Improved stabilization in mood, thinking, and/or behavior Verbal commitment to aftercare and medication compliance Withdrawal symptoms are absent or subacute and managed without 24-hour nursing intervention  PRELIMINARY DISCHARGE PLAN: Attend 12-step recovery group Return to previous living arrangement Return to previous work or school arrangements  PATIENT/FAMIILY INVOLVEMENT: This treatment plan has been presented to and reviewed with the patient, Christie BeckersMichael S Bennetts, and/or family member,  The patient and family have been given the opportunity to ask questions and make suggestions.  JEHU-APPIAH, Nikolette Reindl K 01/18/2014, 2:17 AM

## 2014-01-18 NOTE — Progress Notes (Signed)
NUTRITION ASSESSMENT  Pt identified as at risk on the Malnutrition Screen Tool  INTERVENTION: 1. Educated patient on the importance of nutrition and encouraged intake of food and beverages. 2. Discussed weight goals. 3. Supplements: none at this time  NUTRITION DIAGNOSIS: Increased nutrient needs related to Hx of alcohol abuse as evidenced by estimated nutrient needs.  Goal: Pt to meet >/= 90% of their estimated nutrition needs.  Monitor:  PO intake  Assessment:  38 y.o. patient with a long history of alcohol abuse, with recent decrease, both no success with cessation. Currently the patient streaking approximately 3 beers each day.  Pt reports fluctuating appetite. No weight loss noted. Pt states that he recently had teeth extracted so he has had to make dietary changes to accommodate.   Pt drinks 2-8 12 oz beers daily. Pt is at nutritional risk d/t Hx of alcohol abuse.  Discussed with pt the importance of eating 3 meals a day with snacks, emphasizing protein consumption. Discussed the importance of good nutrition for mental health and aiding in recovery.     Height: Ht Readings from Last 1 Encounters:  01/17/14 6\' 2"  (1.88 m)    Weight: Wt Readings from Last 1 Encounters:  01/17/14 202 lb (91.627 kg)    Weight Hx: Wt Readings from Last 10 Encounters:  01/17/14 202 lb (91.627 kg)  12/14/13 202 lb 3.2 oz (91.717 kg)  10/16/13 195 lb (88.451 kg)  09/18/13 200 lb (90.719 kg)  07/19/13 200 lb (90.719 kg)  06/22/13 193 lb (87.544 kg)  05/31/13 183 lb (83.008 kg)  01/24/12 176 lb 3.2 oz (79.924 kg)    BMI:  25.9 Pt meets criteria for overweight based on current BMI.  Estimated Nutritional Needs: Kcal: 30-35 kcal/kg Protein: > 1 gram protein/kg Fluid: 1 ml/kcal  Diet Order: General Pt is also offered choice of unit snacks mid-morning and mid-afternoon.  Pt is eating as desired.   Lab results and medications reviewed.   Tilda FrancoLindsey Eual Lindstrom, MS, RD, LDN Pager:  [phone] After Hours Pager: [phone]

## 2014-01-18 NOTE — Tx Team (Signed)
Initial Interdisciplinary Treatment Plan   PATIENT STRESSORS: Substance abuse   PROBLEM LIST: Problem List/Patient Goals Date to be addressed Date deferred Reason deferred Estimated date of resolution  ETOH detox 01/18/2014     depression 01/18/2014                                                DISCHARGE CRITERIA:  Improved stabilization in mood, thinking, and/or behavior Motivation to continue treatment in a less acute level of care Verbal commitment to aftercare and medication compliance Withdrawal symptoms are absent or subacute and managed without 24-hour nursing intervention  PRELIMINARY DISCHARGE PLAN: Attend 12-step recovery group Return to previous living arrangement  PATIENT/FAMIILY INVOLVEMENT: This treatment plan has been presented to and reviewed with the patient, Christie BeckersMichael S Scheidegger, and/or family member  The patient and family have been given the opportunity to ask questions and make suggestions.  JEHU-APPIAH, Dreshawn Hendershott K 01/18/2014, 2:42 AM
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
## 2014-01-18 NOTE — Progress Notes (Signed)
Patient ID: Howard BeckersMichael S Rossman, male   DOB: [birth year], 38 y.o.   MRN: [id number] Admission note: D:Patient is a voluntary admission in no acute distress for depression and ETOH detox. Pt reports drinking about 2-8 12oz beer daily. Pt reports he tried to stop drinking many times but went back drinking. Pt reports longest sobriety is 2 years. Pt reports he had all his teeth pulled and currently does not wear dentures. Pt is on disability. Pt denies SI/HI/AVH and pain.  A: Pt admitted to unit per protocol, skin assessment and belonging search done. No skin issues noted. Consent signed by pt. Pt educated on therapeutic milieu rules. Pt was introduced to milieu by nursing staff. Fall risk safety plan explained to the patient. 15 minutes checks started for safety.  R: Pt was receptive to education. Writer offered support.
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
## 2014-01-18 NOTE — Progress Notes (Signed)
Patient ID: Howard Bishop, male   DOB: 10-24-1975, 38 y.o.   MRN: [id number]  PER STATE REGULATIONS 482.30  THIS CHART WAS REVIEWED FOR MEDICAL NECESSITY WITH RESPECT TO THE PATIENT'S ADMISSION/DURATION OF STAY.  NEXT REVIEW DATE: 01/21/14  [name], RN, BSN CASE MANAGER

## 2014-01-18 NOTE — Progress Notes (Addendum)
Pt is very pleasant and cooperative. He stated he needs his minipres for the bad dreams he is having. Pt appears very calm this am and has been in the dayroom with the other pts. He provided the nurse with a list of medications he would like for the doctor to put him on including xanax 4mg   A day. Pt denies Si and HI and contracts for safety. He stated he did not sleep good last pm and required other medication last pm at 2am.12:30p-Pt expresses concerns about some of the other pts and how they are doing. She stated,"I deal with people all the time and can not help but analyze and worry about some of  the patients on the hall.
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
## 2014-01-18 NOTE — H&P (Signed)
Reviewed information above and agree with treatment plan except where it is noted.  Mickelle Goupil ,MD Attending Psychiatrist  [facility]   

## 2014-01-18 NOTE — [facility] Group Notes (Signed)
[facility] LCSW Group Therapy  01/18/2014 2:50 PM  Type of Therapy:  Group Therapy  Participation Level:  Minimal  Participation Quality:  Appropriate  Affect:  Depressed and Flat  Cognitive:  Appropriate  Insight:  Limited  Engagement in Therapy:  Limited  Modes of Intervention:  Discussion, Education, Socialization and Support  Summary of Progress/Problems:Feelings around Relapse. Group members discussed the meaning of relapse and shared personal stories of relapse, how it affected them and others, and how they perceived themselves during this time. Group members were encouraged to identify triggers, warning signs and coping skills used when facing the possibility of relapse. Social supports were discussed and explored in detail. Howard NeedleMichael identified his wife as someone who gives him hope. She is supportive of him.     Bishop,Howard 01/18/2014, 2:50 PM

## 2014-01-19 DIAGNOSIS — F332 Major depressive disorder, recurrent severe without psychotic features: Secondary | ICD-10-CM

## 2014-01-19 DIAGNOSIS — F1010 Alcohol abuse, uncomplicated: Secondary | ICD-10-CM

## 2014-01-19 DIAGNOSIS — F4310 Post-traumatic stress disorder, unspecified: Secondary | ICD-10-CM

## 2014-01-19 LAB — TSH: TSH: 1.18 u[IU]/mL (ref 0.350–4.500)

## 2014-01-19 MED ORDER — LURASIDONE HCL 40 MG PO TABS
40.0000 mg | ORAL_TABLET | Freq: Every day | ORAL | Status: DC
  Administered 2014-01-20 – 2014-01-21 (×2): 40 mg via ORAL
  Filled 2014-01-19 (×4): qty 1

## 2014-01-19 MED ORDER — NICOTINE 21 MG/24HR TD PT24
MEDICATED_PATCH | TRANSDERMAL | Status: AC
  Administered 2014-01-19: 10:00:00
  Filled 2014-01-19: qty 1

## 2014-01-19 NOTE — Progress Notes (Signed)
Patient seen interacting and playing cards with peers. Cheerful and co-perative.  Patient complained of nausea/no vomiting received PO PRN of Zofran. Medication was effective. Will continue to monitor patient.

## 2014-01-19 NOTE — Progress Notes (Signed)
[facility] Group Notes:  (Nursing/MHT/Case Management/Adjunct)  Date:  01/19/2014  Time:  8:56 PM  Type of Therapy:  Psychoeducational Skills  Participation Level:  Active  Participation Quality:  Attentive  Affect:  Appropriate  Cognitive:  Appropriate  Insight:  Good  Engagement in Group:  Engaged  Modes of Intervention:  Education  Summary of Progress/Problems: The patient shared with the group that he had a good day. First of all, the patient mentioned that he had a good visit with his wife. Secondly, he indicated that he enjoyed playing basketball outside with his peers. As a theme for the day, his coping skills are as follows: fishing and hunting   Hazle CocaGOODMAN, Mendel Binsfeld S 01/19/2014, 8:56 PM

## 2014-01-19 NOTE — [facility] Group Notes (Signed)
[facility] Group Notes:  (Nursing/MHT/Case Management/Adjunct)  Date:  01/19/2014  Time:  0900 am  Type of Therapy:  Psychoeducational Skills  Participation Level:  Active  Participation Quality:  Appropriate  Affect:  Flat  Cognitive:  Alert and Appropriate  Insight:  Good  Engagement in Group:  Engaged  Modes of Intervention:  Support  Summary of Progress/Problems: Howard Bishop explained the definition of coping skills and how he applied them in his life.    Howard Bishop, Howard Bishop 01/19/2014, 1:25 PM

## 2014-01-19 NOTE — Progress Notes (Signed)
Patient ID: Howard Bishop, male   DOB: 09-Sep-1975, 38 y.o.   MRN: [id number] D: Patient has been pleasant today, however, he complains of some on-going irritation.  He rates his depression as a 5; anxiety as a 7.  He denies any hopelessness, SI/HI/AVH.  He reports withdrawal symptoms as cravings, agitation, cramping and nausea.  Patient is requesting his prns as necessary with good results.  He is attending groups and participating in his treatment.  He is sleeping poorly and is not requesting any medication.  His goal is to "remain hopeful."   A: Continue to monitor medication management and MD orders.  Safety checks completed every 15 minutes per protocol.  Encourage and support patient as needed. R: Patient is receptive and his behavior is appropriate.

## 2014-01-19 NOTE — Progress Notes (Signed)
[facility] Group Notes:  (Nursing/MHT/Case Management/Adjunct)  Date:  01/19/2014  Time:  7:20 AM  Type of Therapy:  Psychoeducational Skills  Participation Level:  Active  Participation Quality:  Appropriate  Affect:  Appropriate  Cognitive:  Appropriate  Insight:  Appropriate  Engagement in Group:  Engaged  Modes of Intervention:  Education  Summary of Progress/Problems: The patient verbalized that he had a good day overall. He states that he was proud of the fact that he was sober today and that he was able to go outside. The patient had a good talk with his wife. As a theme for the day, he stated that his support system will be his wife.   Tyronn Golda S 01/19/2014, 7:20 AM

## 2014-01-19 NOTE — [facility] Group Notes (Signed)
[facility] Group Notes:  (Clinical Social Work)  01/19/2014  11:15-12:00PM  Summary of Progress/Problems:   The main focus of today's process group was to discuss patients' feelings about hospitalization, the stigma attached to mental health, and sources of motivation to stay well.  We then worked to identify a specific plan to avoid future hospitalizations when discharged from the hospital for this admission.  The patient expressed that he is in the hospital to detox safely from alcohol.  He said it is embarrassing to him to be in the hospital even though he knows that he needs to be here, and even though his family has said they are proud of him for coming to the hospital.  He contributed well and with insight throughout the remainder of group in the discussion about how to talk about having a diagnosis versus "being" a diagnosis.  Type of Therapy:  Group Therapy - Process  Participation Level:  Active  Participation Quality:  Appropriate, Attentive, Sharing and Supportive  Affect:  Blunted  Cognitive:  Alert, Appropriate and Oriented  Insight:  Engaged  Engagement in Therapy:  Engaged  Modes of Intervention:  Exploration, Discussion  Ambrose MantleMareida Grossman-Orr, LCSW 01/19/2014, 1:54 PM
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
## 2014-01-20 MED ORDER — INFLUENZA VAC SPLIT QUAD 0.5 ML IM SUSY
0.5000 mL | PREFILLED_SYRINGE | INTRAMUSCULAR | Status: AC | PRN
  Administered 2014-01-20: 0.5 mL via INTRAMUSCULAR

## 2014-01-20 MED ORDER — NICOTINE 21 MG/24HR TD PT24
MEDICATED_PATCH | TRANSDERMAL | Status: AC
  Administered 2014-01-20: 08:00:00
  Filled 2014-01-20: qty 1

## 2014-01-20 NOTE — Progress Notes (Signed)
[facility]  01/20/2014 10:13 AM Christie BeckersMichael S Guzzi  MRN:  [id number] Subjective:  Patient seen and chart reviewed. During today's assessment patient states he is feeling "great." He is observed lying in his bed, and appears to be resting well. He has no medical concerns at this time. Denies any withdrawal symptoms or cravings at this time. Denies SI/HI.VH at this time. He has been actively attending groups. Only missing one group since admission. Upon discharge he plans to go home to his wife, where he has a good support system in place. He plans to attend AA  meetings.   Objective: Recently started on latuda 40mg , with plans to adjust per need. Patient reports medication compliance at this time, and denies any side effects from such. His behavior on the unit is good, and staff note active participation in groups and milieu therapy.  Diagnosis:   DSM5: Schizophrenia Disorders:  N/A Obsessive-Compulsive Disorders: N/A   Trauma-Stressor Disorders:  N/A Substance/Addictive Disorders:  Alcohol Related Disorder - Severe (303.90) Depressive Disorders:  Major Depressive Disorder - Severe (296.23) Total Time spent with patient: 20 minutes  Axis I: Alcohol Abuse, Major Depression, Recurrent severe and Post Traumatic Stress Disorder Axis II: Deferred Axis IV: economic problems, housing problems, occupational problems, other psychosocial or environmental problems, problems related to social environment, problems with access to health care services and problems with primary support group Axis V: 41-50 serious symptoms  ADL's:  Intact  Sleep: Good  Appetite:  Good  Suicidal Ideation:  Plan:  Denies Intent:  Denies Means:  Denies Homicidal Ideation:  Plan:  Denies Intent:  Denies Means:  Denies AEB (as evidenced by):  Psychiatric Specialty Exam: Physical Exam   ROS   Blood pressure 114/86, pulse 101, temperature 97.6 F (36.4 C), temperature source Oral, resp. rate 16, SpO2  97.00%.There is no weight on file to calculate BMI.  General Appearance: Fairly Groomed  Patent attorneyye Contact::  Good  Speech:  Clear and Coherent and Normal Rate  Volume:  Normal  Mood:  Euthymic  Affect:  Appropriate and Congruent  Thought Process:  Goal Directed and Intact  Orientation:  Full (Time, Place, and Person)  Thought Content:  WDL  Suicidal Thoughts:  No  Homicidal Thoughts:  No  Memory:  Immediate;   Fair Recent;   Fair Remote;   Fair  Judgement:  Fair  Insight:  Good  Psychomotor Activity:  Normal  Concentration:  Good  Recall:  Good  Fund of Knowledge:Fair  Language: Fair  Akathisia:  No  Handed:  Right  AIMS (if indicated):     Assets:  Communication Skills Desire for Improvement Financial Resources/Insurance Social Support Vocational/Educational  Sleep:  Number of Hours: 6.75   Musculoskeletal: Strength & Muscle Tone: within normal limits Gait & Station: normal Patient leans: N/A  Current Medications: Current Facility-Administered Medications  Medication Dose Route Frequency Provider Last Rate Last Dose  . acetaminophen (TYLENOL) tablet 650 mg  650 mg Oral Q6H PRN Lindwood QuaSheila May Agustin, NP      . alum & mag hydroxide-simeth (MAALOX/MYLANTA) 200-200-20 MG/5ML suspension 30 mL  30 mL Oral Q4H PRN Lindwood QuaSheila May Agustin, NP      . chlordiazePOXIDE (LIBRIUM) capsule 25 mg  25 mg Oral Q6H PRN Nanine MeansJamison Lord, NP      . chlordiazePOXIDE (LIBRIUM) capsule 25 mg  25 mg Oral BH-qamhs Nanine MeansJamison Lord, NP   25 mg at 01/20/14 0746   Followed by  . [START ON 01/21/2014] chlordiazePOXIDE (LIBRIUM) capsule 25  mg  25 mg Oral Daily Nanine MeansJamison Lord, NP      . DULoxetine (CYMBALTA) DR capsule 60 mg  60 mg Oral Daily Lindwood QuaSheila May Agustin, NP   60 mg at 01/20/14 0747  . gabapentin (NEURONTIN) capsule 300 mg  300 mg Oral QID Jomarie LongsSaramma Eappen, MD   300 mg at 01/20/14 0746  . hydrOXYzine (ATARAX/VISTARIL) tablet 25 mg  25 mg Oral Q6H PRN Nanine MeansJamison Lord, NP   25 mg at 01/19/14 0617  . levETIRAcetam  (KEPPRA) tablet 500 mg  500 mg Oral BID Nanine MeansJamison Lord, NP   500 mg at 01/20/14 0747  . lisinopril (PRINIVIL,ZESTRIL) tablet 10 mg  10 mg Oral Daily Jomarie LongsSaramma Eappen, MD   10 mg at 01/20/14 0746  . loperamide (IMODIUM) capsule 2-4 mg  2-4 mg Oral PRN Nanine MeansJamison Lord, NP      . lurasidone (LATUDA) tablet 40 mg  40 mg Oral Q breakfast Truman Haywardakia S Starkes, FNP   40 mg at 01/20/14 0746  . magnesium hydroxide (MILK OF MAGNESIA) suspension 30 mL  30 mL Oral Daily PRN Velna HatchetSheila May Agustin, NP      . multivitamin with minerals tablet 1 tablet  1 tablet Oral Daily Nanine MeansJamison Lord, NP   1 tablet at 01/20/14 0746  . nicotine (NICODERM CQ - dosed in mg/24 hours) 21 mg/24hr patch           . ondansetron (ZOFRAN-ODT) disintegrating tablet 4 mg  4 mg Oral Q6H PRN Nanine MeansJamison Lord, NP   4 mg at 01/19/14 0617  . prazosin (MINIPRESS) capsule 5 mg  5 mg Oral QHS Jomarie LongsSaramma Eappen, MD   5 mg at 01/19/14 2118  . thiamine (B-1) injection 100 mg  100 mg Intramuscular Once Nanine MeansJamison Lord, NP      . thiamine (VITAMIN B-1) tablet 100 mg  100 mg Oral Daily Nanine MeansJamison Lord, NP   100 mg at 01/20/14 0746  . traZODone (DESYREL) tablet 100 mg  100 mg Oral QHS PRN Nanine MeansJamison Lord, NP   100 mg at 01/19/14 2118    Lab Results:  Results for orders placed during the hospital encounter of 01/18/14 (from the past 48 hour(s))  ALT     Status: None   Collection Time    01/18/14  7:07 PM      Result Value Ref Range   ALT 31  0 - 53 U/L   Comment[facility]  TSH     Status: None   Collection Time    01/18/14  7:07 PM      Result Value Ref Range   TSH 1.180  0.350 - 4.500 uIU/mL   Comment[facility]  AST     Status: None   Collection Time    01/18/14  7:07 PM      Result Value Ref Range   AST 20  0 - 37 U/L   Comment: [facility]    Physical Findings: AIMS: Facial and Oral Movements Muscles of Facial Expression: None, normal Lips and Perioral Area: None,  normal Jaw: None, normal Tongue: None, normal,Extremity Movements Upper (arms, wrists, hands, fingers): None, normal Lower (legs, knees, ankles, toes): None, normal, Trunk Movements Neck, shoulders, hips: None, normal, Overall Severity Severity of abnormal movements (highest score from questions above): None, normal Incapacitation due to abnormal movements: None, normal Patient's awareness of abnormal movements (rate only patient's report): No Awareness, Dental Status Current problems with teeth and/or dentures?: Yes Does  patient usually wear dentures?: No  CIWA:  CIWA-Ar Total: 0 COWS:     Treatment Plan Summary: Daily contact with patient to assess and evaluate symptoms and progress in treatment Medication management  Plan: 1 Admit for crisis management and stabilization.  2. Medication management to reduce symptoms to baseline and improved the patient's overall level of functioning. Closely monitor the side effects, efficacy and therapeutic response of medication. Will resume Latuda 40mg  1 tablet po daily, increase dose accordingly.  3. Treat health problem as indicated.  4. Developed treatment plan to decrease the risk of relapse upon discharge and to reduce the need for readmission.  5. Psychosocial education regarding relapse prevention in self-care.  6. Healthcare followup as needed for medical problems and called consults as indicated.  7. Increase collateral information.  8. Restart home medication where appropriate  9. Encouraged to participate and verbalize into group milieu therapy.   Medical Decision Making Problem Points:  Established problem, stable/improving (1), Review of last therapy session (1) and Review of psycho-social stressors (1) Data Points:  Decision to obtain old records (1) Review or order clinical lab tests (1) Review or order medicine tests (1) Review and summation of old records (2) Review of medication regiment & side effects (2) Review of new  medications or change in dosage (2)  I certify that inpatient services furnished can reasonably be expected to improve the patient's condition.   Malachy Chamber S FNP-BC 01/20/2014, 10:13 AM I agreed with findings and treatment plan of this patient

## 2014-01-20 NOTE — Progress Notes (Addendum)
Pt  Appears to be depressed but does brighten upon approach. He stated he slept good last pm and the highlight of his stay was a good visit with his wife last pm. Pt denies SI and HI and contracts for safety.Pt does not have any withdrawal symptoms. His CIWA is a 0 this am. Pt c/o earlier of feeling very anxious. He was medicated by nurse Linsay with 25mg  of visteral. Pt at 12noon stated he feels much better and is not anxious at all. Pt rates his depression a 0/10 ,hopelessness a 0/10 and his anxiety a 3/`0. He feels that the coping skills strategies are working. CIWA at 12noon was a 2.4:30pm -pt stated he felt anxious a 8/10. Pt was given 25mg  prn librium.

## 2014-01-20 NOTE — [facility] Group Notes (Signed)
[facility] Group Notes:  Healthy support systems  Date:  01/20/2014  Time:  9:38 AM  Type of Therapy:  Nurse Education  Participation Level:  Did Not Attend  Participation Quality:  Inattentive  Affect:  Depressed  Cognitive:  Lacking  Insight:  None  Engagement in Group:  None  Modes of Intervention:  Education  Summary of Progress/Problems:Pt did not attend group  Rodman KeyWebb, Marvion Bastidas [facility] Hospital[facility] 01/20/2014, 9:38 AM

## 2014-01-20 NOTE — Progress Notes (Signed)
D: Pt denies SI/HI/AVH. Pt is pleasant and cooperative. Pt stated he felt better today, after talking with his wife yesterday he felt good. Pt stated his withdrawal Sx are much better.   A: Pt was offered support and encouragement. Pt was given scheduled medications. Pt was encourage to attend groups. Q 15 minute checks were done for safety.   R:Pt attends groups and interacts well with peers and staff. Pt is taking medication. Pt has no complaints at this time.Pt receptive to treatment and safety maintained on unit.

## 2014-01-20 NOTE — [facility] Group Notes (Signed)
[facility] Group Notes:  Self inventory group  Date:  01/20/2014  Time: 9:00am  Type of Therapy:  Nurse Education  Participation Level:  Did Not Attend  Participation Quality:  Intrusive and Inattentive  Affect:  Flat  Cognitive:  Lacking  Insight:  None  Engagement in Group:  None  Modes of Intervention:  Discussion  Summary of Progress/Problems:Pt did not attend  Howard Bishop, Howard Bishop [facility] 01/20/2014, 10:09 AM

## 2014-01-20 NOTE — [facility] Group Notes (Signed)
[facility] Group Notes:  (Clinical Social Work)  01/20/2014   11:15am-12:00pm  Summary of Progress/Problems:  The main focus of today's process group was to listen to a variety of genres of music and to identify that different types of music provoke different responses.  The patient then was able to identify personally what was soothing for them, as well as energizing.  The patient expressed understanding of concepts, as well as knowledge of how each type of music affected him and how this can be used at home as a wellness/recovery tool.  Howard Bishop sang along with most songs, and seemed to really enjoy the hour.  He stated he was happy at a 9 out of 10 prior to group starting, was happy at 10 out of 10 at the end of group.  Type of Therapy:  Music Therapy   Participation Level:  Active  Participation Quality:  Attentive and Sharing  Affect:  Blunted  Cognitive:  Oriented  Insight:  Engaged  Engagement in Therapy:  Engaged  Modes of Intervention:   Activity, Exploration  Ambrose MantleMareida Grossman-Orr, LCSW 01/20/2014, 12:30pm

## 2014-01-20 NOTE — Progress Notes (Signed)
[facility] Group Notes:  (Nursing/MHT/Case Management/Adjunct)  Date:  01/20/2014  Time:  8:47 PM  Type of Therapy:  Psychoeducational Skills  Participation Level:  Active  Participation Quality:  Appropriate  Affect:  Appropriate  Cognitive:  Appropriate  Insight:  Good  Engagement in Group:  Engaged  Modes of Intervention:  Education  Summary of Progress/Problems: The patient mentioned that he had a good day for a number of reasons. He indicated that he did not crave any alcohol today. In addition, he mentioned that he had a good visit with his wife today. As a theme for the day, his discharge plans will include returning to his home and attending intensive out patient therapy. He acknowledges that he needs to spend more of his energy trying to remain sober.  Hazle CocaGOODMAN, Aideliz Garmany S 01/20/2014, 8:47 PM

## 2014-01-20 NOTE — Progress Notes (Signed)
D: Pt mood is depressed but he brightens upon approach. States that he did not sleep well last night and would like something for sleep tonight.  A: Support given. Verbalization encouraged. Medications given as prescribed. Pt encouraged to come to nurse with any concerns.  R: Pt is receptive. No complaints of pain or discomfort at this time. Q15 min safety checks maintained. Will continue to monitor pt.

## 2014-01-21 DIAGNOSIS — F19980 Other psychoactive substance use, unspecified with psychoactive substance-induced anxiety disorder: Secondary | ICD-10-CM

## 2014-01-21 DIAGNOSIS — F1020 Alcohol dependence, uncomplicated: Principal | ICD-10-CM

## 2014-01-21 DIAGNOSIS — F10959 Alcohol use, unspecified with alcohol-induced psychotic disorder, unspecified: Secondary | ICD-10-CM

## 2014-01-21 DIAGNOSIS — F1094 Alcohol use, unspecified with alcohol-induced mood disorder: Secondary | ICD-10-CM

## 2014-01-21 DIAGNOSIS — F10230 Alcohol dependence with withdrawal, uncomplicated: Secondary | ICD-10-CM

## 2014-01-21 MED ORDER — LEVETIRACETAM 500 MG PO TABS: 500.0000 mg | ORAL_TABLET | Freq: Two times a day (BID) | ORAL | Status: DC

## 2014-01-21 MED ORDER — TRAZODONE HCL 100 MG PO TABS: 100.0000 mg | ORAL_TABLET | Freq: Every evening | ORAL | Status: DC | PRN

## 2014-01-21 MED ORDER — PRAZOSIN HCL 5 MG PO CAPS: 5.0000 mg | ORAL_CAPSULE | Freq: Every day | ORAL | Status: DC

## 2014-01-21 MED ORDER — DULOXETINE HCL 60 MG PO CPEP: 60.0000 mg | ORAL_CAPSULE | Freq: Every day | ORAL | Status: DC

## 2014-01-21 MED ORDER — GABAPENTIN 300 MG PO CAPS: 300.0000 mg | ORAL_CAPSULE | Freq: Four times a day (QID) | ORAL | Status: DC

## 2014-01-21 MED ORDER — LISINOPRIL 10 MG PO TABS: 10.0000 mg | ORAL_TABLET | Freq: Every day | ORAL | Status: DC

## 2014-01-21 MED ORDER — LURASIDONE HCL 40 MG PO TABS: 40.0000 mg | ORAL_TABLET | Freq: Every day | ORAL | Status: DC

## 2014-01-21 MED ORDER — HYDROXYZINE HCL 25 MG PO TABS: 25.0000 mg | ORAL_TABLET | Freq: Four times a day (QID) | ORAL | Status: DC | PRN

## 2014-01-21 MED ORDER — NICOTINE 21 MG/24HR TD PT24
21.0000 mg | MEDICATED_PATCH | Freq: Every day | TRANSDERMAL | Status: DC
  Administered 2014-01-21: 21 mg via TRANSDERMAL
  Filled 2014-01-21 (×4): qty 1

## 2014-01-21 NOTE — Plan of Care (Signed)
Problem: Consults Goal: Depression Patient Education See Patient Education Module for education specifics.  Outcome: Completed/Met Date Met:  01/21/14 Patient stated he will discuss with staff depression or SI thoughts.

## 2014-01-21 NOTE — Plan of Care (Signed)
Problem: Alteration in mood Goal: LTG-Patient reports reduction in suicidal thoughts (Patient reports reduction in suicidal thoughts and is able to verbalize a safety plan for whenever patient is feeling suicidal)  Outcome: Progressing Pt deniesSI  Problem: Ineffective individual coping Goal: LTG: Patient will report a decrease in negative feelings Outcome: Progressing Pt stated he was feeling better after wife visited

## 2014-01-21 NOTE — Progress Notes (Signed)
[facility] :  Will you be returning to the same living situation after discharge: Yes,  home At discharge, do you have transportation home?:Yes,  family Do you have the ability to pay for your medications:Yes,  MCR, MCD  Release of information consent forms completed and in the chart;  Patient's signature needed at discharge.  Patient to Follow up at: Follow-up Information   Follow up with [facility] On 01/22/2014. (Tuesday at 10 AM for an assessment with Thurston HoleStan Holder)    Contact information:   [address]  [place]  [phone]        [facility] (Follow up with Dr Tenny Crawoss at your next scheduled appointment)    Contact information:   [address]  [place]  [phone]      Patient denies SI/HI:   Yes,  yes    Safety Planning and Suicide Prevention discussed:  Yes,  yes  Ida Rogueorth, Jere Vanburen B 01/21/2014, 10:47 AM

## 2014-01-21 NOTE — Progress Notes (Signed)
Discharge Note:  Patient discharged home with wife.  Denied SI and HI.   Denied A/V hallucinations.  Suicide prevention information given and discussed with patient who stated he understood and had no questions.  Patient stated he received all his belongings, clothing, toiletries, misc items, prescriptions, medications.  Patient stated he appreciated all assistance received from [facility].

## 2014-01-21 NOTE — Tx Team (Signed)
  Interdisciplinary Treatment Plan Update   Date Reviewed:  01/21/2014  Time Reviewed:  10:44 AM  Progress in Treatment:   Attending groups: Yes Participating in groups: Yes Taking medication as prescribed: Yes  Tolerating medication: Yes Family/Significant other contact made: Yes  Patient understands diagnosis: Yes  Discussing patient identified problems/goals with staff: Yes  See initial care plan Medical problems stabilized or resolved: Yes Denies suicidal/homicidal ideation: Yes  In tx team Patient has not harmed self or others: Yes  For review of initial/current patient goals, please see plan of care.  Estimated Length of Stay:  D/C today  Reason for Continuation of Hospitalization:   New Problems/Goals identified:  N/A  Discharge Plan or Barriers:   return home, follow up outpt  Additional Comments:  Attendees:  Signature: Ivin BootySarama Eappen, MD 01/21/2014 10:44 AM   Signature: Richelle Itood Alexa Golebiewski, LCSW 01/21/2014 10:44 AM  Signature:  01/21/2014 10:44 AM  Signature: Marzetta Boardhrista Dopson, RN 01/21/2014 10:44 AM  Signature:  01/21/2014 10:44 AM  Signature:  01/21/2014 10:44 AM  Signature:   01/21/2014 10:44 AM  Signature:    Signature:    Signature:    Signature:    Signature:    Signature:      Scribe for Treatment Team:   Richelle Itood Melissia Lahman, LCSW  01/21/2014 10:44 AM

## 2014-01-21 NOTE — [facility] Suicide Risk Assessment (Signed)
[facility] INPATIENT:  Family/Significant Other Suicide Prevention Education  Suicide Prevention Education:  Education Completed; Sabino DickChristina Ocanas, wife, [phone] has been identified by the patient as the family member/significant other with whom the patient will be residing, and identified as the person(s) who will aid the patient in the event of a mental health crisis (suicidal ideations/suicide attempt).  With written consent from the patient, the family member/significant other has been provided the following suicide prevention education, prior to the and/or following the discharge of the patient.  The suicide prevention education provided includes the following:  Suicide risk factors  Suicide prevention and interventions  National Suicide Hotline telephone number  [facility] assessment telephone number  [facility] Emergency Assistance 911  [facility] and/or Residential Mobile Crisis Unit telephone number  Request made of family/significant other to:  Remove weapons (e.g., guns, rifles, knives), all items previously/currently identified as safety concern.    Remove drugs/medications (over-the-counter, prescriptions, illicit drugs), all items previously/currently identified as a safety concern.  The family member/significant other verbalizes understanding of the suicide prevention education information provided.  The family member/significant other agrees to remove the items of safety concern listed above.  Daryel Geraldorth, Korena Nass B 01/21/2014, 12:06 PM

## 2014-01-21 NOTE — Progress Notes (Signed)
D:  Patient's self inventory sheet, patient slept good, sleep medication is helpful.  Good appetite, normal energy level, good concentration.  Denied depression, hopeless and anxiety.  Denied withdrawals.  Denied SI.  Denied physical problems.  Denied physical pain.  Denied need for pain medication.  Goal today is to work on discharge planning.  Plan to meet with SW to plan discharge.  Does have discharge plan.  No problems with following up with discharge. A:  Medications administered per MD orders.  Emotional support and encouragement given patient. R:  Denied IS and HI.  Denied A/V hallucinations.  Safety maintained with 15 minute checks.

## 2014-01-21 NOTE — [facility] Suicide Risk Assessment (Signed)
   Demographic Factors:  Male, Caucasian and Low socioeconomic status  Total Time spent with patient: 45 minutes  Psychiatric Specialty Exam: Physical Exam  Constitutional: He is oriented to person, place, and time. He appears well-developed and well-nourished.  HENT:  Head: Normocephalic and atraumatic.  Eyes: EOM are normal.  Neck: Normal range of motion. Neck supple.  Respiratory: Effort normal.  GI: Soft.  Musculoskeletal: Normal range of motion.  Neurological: He is alert and oriented to person, place, and time.  Skin: Skin is warm.  Psychiatric: He has a normal mood and affect. His behavior is normal. Judgment and thought content normal.    Review of Systems  Constitutional: Negative.   HENT: Negative.   Eyes: Negative.   Respiratory: Negative.   Cardiovascular: Negative.   Gastrointestinal: Negative.   Genitourinary: Negative.   Musculoskeletal: Negative.   Skin: Negative.   Psychiatric/Behavioral: Positive for substance abuse. Negative for suicidal ideas and hallucinations.    Blood pressure 134/70, pulse 94, temperature 97.9 F (36.6 C), temperature source Oral, resp. rate 16, SpO2 97.00%.There is no weight on file to calculate BMI.  General Appearance: Casual  Eye Contact::  Fair  Speech:  Clear and Coherent  Volume:  Normal  Mood:  Euthymic  Affect:  Congruent  Thought Process:  Coherent  Orientation:  Full (Time, Place, and Person)  Thought Content:  WDL  Suicidal Thoughts:  No  Homicidal Thoughts:  No  Memory:  Immediate;   Fair Recent;   Fair Remote;   Fair  Judgement:  Fair  Insight:  Fair  Psychomotor Activity:  Normal  Concentration:  Fair  Recall:  Fis[employer]Fair  Fund of Knowledge:Good  Language: Good  Akathisia:  No  Handed:  Right  AIMS (if indicated):     Assets:  Communication Skills Desire for Improvement Housing Social Support Talents/Skills  Sleep:  Number of Hours: 6.5    Musculoskeletal: Strength & Muscle Tone: within normal  limits Gait & Station: normal Patient leans: N/A   Mental Status Per Nursing Assessment::   On Admission:  NA  Current Mental Status by Physician: Pt denies SI/HI/AH/VH  Loss Factors: NA  Historical Factors: Impulsivity  Risk Reduction Factors:   Responsible for children under 38 years of age, Sense of responsibility to family, Religious beliefs about death and Employed  Continued Clinical Symptoms:  Alcohol/Substance Abuse/Dependencies  Cognitive Features That Contribute To Risk:  Polarized thinking    Suicide Risk:  Minimal: No identifiable suicidal ideation.     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 ADHD (attention deficit hyperactivity disorder)   . Bipolar disorder   . Anxiety   . PTSD (post-traumatic stress disorder)   . Hepatitis C antibody test positive   . Headache(784.0)   . Seizures   . Hypertension   . Schizoaffective disorder   . Chronic kidney disease     Plan Of Care/Follow-up recommendations:  Activity:  SEE DC INSTRUCTIONS  Is patient on multiple antipsychotic therapies at discharge:  No   Has Patient had three or more failed trials of antipsychotic monotherapy by history:  No  Recommended Plan for Multiple Antipsychotic Therapies: NA    Haji Delaine MD 01/21/2014, 9:32 AM

## 2014-01-21 NOTE — Discharge Summary (Signed)
Physician Discharge Summary Note  Patient:  Howard BeckersMichael S Bishop is an 38 y.o., male MRN:  [id number] DOB:  [birth year] Patient phone:  [phone] (home)  Patient address:   [address] [place] [id number],  Total Time spent with patient: 45 minutes  Date of Admission:  01/18/2014 Date of Discharge: 01/21/2014  Reason for Admission:  Depression and wanting detox   Discharge Diagnoses:DSM 5 Primary Psychiatric Diagnosis:  Alcohol use disorder ,severe   Secondary Psychiatric Diagnosis:  Substance induced (alcohol) psychotic disorder  Substance induced (alcohol) depressive disorder  Schizoaffective disorder per history  PTSD per history   Non Psychiatric Diagnosis:  Seizure disorder  Hepatitis C               Active Problems:   Alcohol abuse   Psychiatric Specialty Exam: Physical Exam  Vitals reviewed. Psychiatric: He has a normal mood and affect. His speech is normal and behavior is normal. Judgment and thought content normal. Cognition and memory are normal.    Review of Systems  Constitutional: Negative.   HENT: Negative.   Eyes: Negative.   Respiratory: Negative.   Cardiovascular: Negative.   Gastrointestinal: Negative.   Genitourinary: Negative.   Musculoskeletal: Negative.   Skin: Negative.   Neurological: Negative.   Endo/Heme/Allergies: Negative.   Psychiatric/Behavioral: Positive for depression (Hx of, chronic, stabilized ). Negative for suicidal ideas, hallucinations and substance abuse. The patient is nervous/anxious (Hx of, chronic). The patient does not have insomnia.     Blood pressure 120/71, pulse 91, temperature 97.9 F (36.6 C), temperature source Oral, resp. rate 16, SpO2 97.00%.There is no weight on file to calculate BMI.   Past Psychiatric History:  Diagnosis: Depression and substance abuse   [facility] (states 2009 & 2010), [facility]   Outpatient Care: [facility]    Substance Abuse Care: [facility]   Self-Mutilation: Denies   Suicidal Attempts: Yes   Violent Behaviors: Yes    Musculoskeletal: Strength & Muscle Tone: within normal limits Gait & Station: normal Patient leans: N/A   Past Medical History  Diagnosis Date  . ADHD (attention deficit hyperactivity disorder)   . Bipolar disorder   . Anxiety   . PTSD (post-traumatic stress disorder)   . Hepatitis C antibody test positive   . Headache(784.0)   . Seizures   . Hypertension   . Schizoaffective disorder   . Chronic kidney disease    Level of Care:  OP  Hospital Course:   Bernette RedbirdMichael Bishop is a 38 year old male who [facility] on [facility] volition [facility] stating depression, suicidal with plan (overdosing on BP meds) and wanting etoh detox. He stated that his drinking and depression has worsened in the last few weeks due family stressors.  He has a history of inpatient hospitalization for mental health problems.  Per patient, he started to drink when he was in his teens.  He was also diagnosed with PTSD, schizoaffective disorder and depression.  [name] verbalized wanting to get help and was motivated by his young daughter.  This was clearly evident during admission interview.  He appeared genuinely concerned about his current state in life.  To help [name] stabilize his moods, his medications needed to be adjusted.  We continued his DULoxetine (CYMBALTA) 60 MG to help with his depression.  For mood stabilization, he was ordered levETIRAcetam (KEPPRA) 500 MG and tolerated well without reports of side effects.  He was also continued on Latuda 40mg   which was recently started by his outpatient provider and he is compliant on it.    His behavior on the unit was good, and staff noted active participation in groups and milieu therapy.  He attended all but one session.  Denied SI/HI.VH.  Did not endorse withdrawals or cravings on day of discharge.  He felt well and looked forward to going home with his  wife.  He stated good support system in place.  He planned to attend AA meetings.    To note, he has a PMH of hypertension.  His BP was stable throughout admission.    Consults:  psychiatry  Significant Diagnostic Studies:  labs: Per ED  Discharge Vitals:   Blood pressure 120/71, pulse 91, temperature 97.9 F (36.6 C), temperature source Oral, resp. rate 16, SpO2 97.00%. There is no weight on file to calculate BMI. Lab Results:   Results for orders placed during the hospital encounter of 01/18/14 (from the past 72 hour(s))  ALT     Status: None   Collection Time    01/18/14  7:07 PM      Result Value Ref Range   ALT 31  0 - 53 U/L   Comment: Performed at [facility]  TSH     Status: None   Collection Time    01/18/14  7:07 PM      Result Value Ref Range   TSH 1.180  0.350 - 4.500 uIU/mL   Comment: Performed at [facility]  AST     Status: None   Collection Time    01/18/14  7:07 PM      Result Value Ref Range   AST 20  0 - 37 U/L   Comment: Performed at [facility]    Physical Findings: AIMS: Facial and Oral Movements Muscles of Facial Expression: None, normal Lips and Perioral Area: None, normal Jaw: None, normal Tongue: None, normal,Extremity Movements Upper (arms, wrists, hands, fingers): None, normal Lower (legs, knees, ankles, toes): None, normal, Trunk Movements Neck, shoulders, hips: None, normal, Overall Severity Severity of abnormal movements (highest score from questions above): None, normal Incapacitation due to abnormal movements: None, normal Patient's awareness of abnormal movements (rate only patient's report): No Awareness, Dental Status Current problems with teeth and/or dentures?: Yes Does patient usually wear dentures?: No  CIWA:  CIWA-Ar Total: 1 COWS:  COWS Total Score: 1  Psychiatric Specialty Exam: See Psychiatric Specialty Exam and Suicide Risk Assessment completed by Attending Physician prior to  discharge.  Discharge destination:  Home  Is patient on multiple antipsychotic therapies at discharge:  No   Has Patient had three or more failed trials of antipsychotic monotherapy by history:  No  Recommended Plan for Multiple Antipsychotic Therapies: NA     Medication List    STOP taking these medications       ALPRAZolam 1 MG tablet  Commonly known as:  XANAX      TAKE these medications     Indication   DULoxetine 60 MG capsule  Commonly known as:  CYMBALTA  Take 1 capsule (60 mg total) by mouth daily. For depression   Indication:  Major Depressive Disorder     gabapentin 300 MG capsule  Commonly known as:  NEURONTIN  Take 1 capsule (300 mg total) by mouth 4 (four) times daily.   Indication:  Pain     hydrOXYzine 25 MG tablet  Commonly known as:  ATARAX/VISTARIL  Take 1 tablet (25 mg total) by  mouth every 6 (six) hours as needed for anxiety (or CIWA score </= 10).   Indication:  Anxiety Neurosis     levETIRAcetam 500 MG tablet  Commonly known as:  KEPPRA  Take 1 tablet (500 mg total) by mouth 2 (two) times daily. For mood stabilization   Indication:  Mood Stabilization     lisinopril 10 MG tablet  Commonly known as:  PRINIVIL,ZESTRIL  Take 1 tablet (10 mg total) by mouth daily. For high blood pressure   Indication:  High Blood Pressure     lurasidone 40 MG Tabs tablet  Commonly known as:  LATUDA  Take 1 tablet (40 mg total) by mouth daily with breakfast. For mood stabilization   Indication:  Mood Stabilization     prazosin 5 MG capsule  Commonly known as:  MINIPRESS  Take 1 capsule (5 mg total) by mouth at bedtime. For nightmares   Indication:  High Blood Pressure, Nightmares     traZODone 100 MG tablet  Commonly known as:  DESYREL  Take 1 tablet (100 mg total) by mouth at bedtime as needed for sleep. For sleep/insomnia   Indication:  Trouble Sleeping       Follow-up Information   Follow up with [facility] On 01/22/2014. (Tuesday at 10  AM for an assessment with Thurston HoleStan Holder)    Contact information:   [address]  [place]  [phone]        [facility] (Follow up with Dr Tenny Crawoss at your next scheduled appointment)    Contact information:   [address]  [place]  [phone]      Follow-up recommendations:  Activity:  As tolerated Diet:  As tolerated  Comments:  1.  Take all your medications as prescribed.              2.  Report any adverse side effects to outpatient provider.                       3.  Patient instructed to not use alcohol or illegal drugs while on prescription medicines.            4.  In the event of worsening symptoms, instructed patient to call 911, the crisis hotline or go to nearest emergency room for evaluation of symptoms.  Total Discharge Time:  Greater than 30 minutes.  SignedLindwood Qua: AGUSTIN, SHEILA MAY, AGNP-BC 01/21/2014, 5:23 PM   Patient was seen face to face for psychiatric evaluation, suicide risk assessment and case discussed with treatment team and NP and made appropriate disposition plans. Reviewed the information documented and agree with the treatment plan.    Jomarie LongsSaramma Maryellen Dowdle ,MD Attending Psychiatrist  [facility]

## 2014-01-24 NOTE — Progress Notes (Signed)
Patient Discharge Instructions:  After Visit Summary (AVS):   Faxed to:  01/24/14 Discharge Summary Note:   Faxed to:  01/24/14 Psychiatric Admission Assessment Note:   Faxed to:  01/24/14 Suicide Risk Assessment - Discharge Assessment:   Faxed to:  01/24/14 Faxed/Sent to the Next Level Care provider:  01/24/14 Next Level Care Provider Has Access to the EMR, 01/24/14 Faxed to [facility] [phone] [facility] via CHL/Epic access.  Jerelene ReddenSheena E [place], 01/24/2014, 3:39 PM

## 2014-01-29 ENCOUNTER — Telehealth (HOSPITAL_COMMUNITY): Payer: Self-pay | Admitting: *Deleted

## 2014-01-31 ENCOUNTER — Ambulatory Visit (INDEPENDENT_AMBULATORY_CARE_PROVIDER_SITE_OTHER): Payer: Medicare Other | Admitting: Psychology

## 2014-01-31 ENCOUNTER — Encounter (HOSPITAL_COMMUNITY): Payer: Self-pay | Admitting: Psychology

## 2014-01-31 DIAGNOSIS — F1020 Alcohol dependence, uncomplicated: Secondary | ICD-10-CM

## 2014-01-31 DIAGNOSIS — F4310 Post-traumatic stress disorder, unspecified: Secondary | ICD-10-CM
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
## 2014-03-04 ENCOUNTER — Ambulatory Visit (INDEPENDENT_AMBULATORY_CARE_PROVIDER_SITE_OTHER): Payer: Medicare Other | Admitting: Psychology

## 2014-03-04 ENCOUNTER — Encounter (HOSPITAL_COMMUNITY): Payer: Self-pay | Admitting: Psychology

## 2014-03-04 DIAGNOSIS — F4310 Post-traumatic stress disorder, unspecified: Secondary | ICD-10-CM

## 2014-03-04 DIAGNOSIS — F250 Schizoaffective disorder, bipolar type: Secondary | ICD-10-CM
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
## 2014-03-11 ENCOUNTER — Ambulatory Visit (HOSPITAL_COMMUNITY): Payer: Self-pay | Admitting: Psychiatry

## 2014-03-11 ENCOUNTER — Ambulatory Visit (INDEPENDENT_AMBULATORY_CARE_PROVIDER_SITE_OTHER): Payer: Medicare Other | Admitting: Psychiatry

## 2014-03-11 ENCOUNTER — Encounter (HOSPITAL_COMMUNITY): Payer: Self-pay | Admitting: Psychiatry

## 2014-03-11 ENCOUNTER — Encounter: Payer: Self-pay | Admitting: Psychiatry

## 2014-03-11 VITALS — BP 154/86 | HR 72 | Ht 74.0 in | Wt 193.6 lb

## 2014-03-11 DIAGNOSIS — F4310 Post-traumatic stress disorder, unspecified: Secondary | ICD-10-CM

## 2014-03-11 DIAGNOSIS — F1020 Alcohol dependence, uncomplicated: Secondary | ICD-10-CM

## 2014-03-11 DIAGNOSIS — F209 Schizophrenia, unspecified: Secondary | ICD-10-CM

## 2014-03-11 DIAGNOSIS — F1010 Alcohol abuse, uncomplicated: Secondary | ICD-10-CM

## 2014-03-11 MED ORDER — CHLORDIAZEPOXIDE HCL 10 MG PO CAPS: 10.0000 mg | ORAL_CAPSULE | Freq: Three times a day (TID) | ORAL | Status: DC | PRN

## 2014-03-11 MED ORDER — TRAZODONE HCL 100 MG PO TABS: 100.0000 mg | ORAL_TABLET | Freq: Every evening | ORAL | Status: DC | PRN

## 2014-03-11 MED ORDER — LURASIDONE HCL 80 MG PO TABS: 80.0000 mg | ORAL_TABLET | Freq: Every day | ORAL | Status: DC

## 2014-03-11 MED ORDER — LEVETIRACETAM 500 MG PO TABS: ORAL_TABLET | ORAL | Status: DC

## 2014-03-11 MED ORDER — PRAZOSIN HCL 5 MG PO CAPS: 5.0000 mg | ORAL_CAPSULE | Freq: Every day | ORAL | Status: DC

## 2014-03-11 MED ORDER — DULOXETINE HCL 60 MG PO CPEP: 60.0000 mg | ORAL_CAPSULE | Freq: Every day | ORAL | Status: DC

## 2014-03-11 MED ORDER — GABAPENTIN 300 MG PO CAPS: 300.0000 mg | ORAL_CAPSULE | Freq: Four times a day (QID) | ORAL | Status: DC
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
## 2014-03-15 ENCOUNTER — Ambulatory Visit (HOSPITAL_COMMUNITY): Payer: Self-pay | Admitting: Psychiatry

## 2014-03-18 ENCOUNTER — Telehealth (HOSPITAL_COMMUNITY): Payer: Self-pay | Admitting: *Deleted

## 2014-03-18 NOTE — Telephone Encounter (Signed)
lmtcb 8:31am 03-18-14 to resch appt due to provider out of office. Number provided

## 2014-04-04 ENCOUNTER — Other Ambulatory Visit (HOSPITAL_COMMUNITY): Payer: Self-pay | Admitting: Psychiatry

## 2014-04-05 ENCOUNTER — Encounter (HOSPITAL_COMMUNITY): Payer: Self-pay | Admitting: Psychology

## 2014-04-05 ENCOUNTER — Ambulatory Visit (INDEPENDENT_AMBULATORY_CARE_PROVIDER_SITE_OTHER): Payer: 59 | Admitting: Psychology

## 2014-04-05 DIAGNOSIS — F4310 Post-traumatic stress disorder, unspecified: Secondary | ICD-10-CM

## 2014-04-05 DIAGNOSIS — F1020 Alcohol dependence, uncomplicated: Secondary | ICD-10-CM

## 2014-04-05 DIAGNOSIS — F250 Schizoaffective disorder, bipolar type: Secondary | ICD-10-CM
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
## 2014-04-15 ENCOUNTER — Ambulatory Visit (HOSPITAL_COMMUNITY): Payer: Self-pay | Admitting: Psychiatry

## 2014-04-23 ENCOUNTER — Ambulatory Visit (INDEPENDENT_AMBULATORY_CARE_PROVIDER_SITE_OTHER): Payer: 59 | Admitting: Psychiatry

## 2014-04-23 ENCOUNTER — Encounter (HOSPITAL_COMMUNITY): Payer: Self-pay | Admitting: Psychiatry

## 2014-04-23 VITALS — BP 127/78 | HR 80 | Ht 74.0 in | Wt 203.4 lb

## 2014-04-23 DIAGNOSIS — F259 Schizoaffective disorder, unspecified: Secondary | ICD-10-CM

## 2014-04-23 DIAGNOSIS — F1010 Alcohol abuse, uncomplicated: Secondary | ICD-10-CM

## 2014-04-23 DIAGNOSIS — F4310 Post-traumatic stress disorder, unspecified: Secondary | ICD-10-CM

## 2014-04-23 MED ORDER — LURASIDONE HCL 80 MG PO TABS: 80.0000 mg | ORAL_TABLET | Freq: Every day | ORAL | Status: DC

## 2014-04-23 MED ORDER — PRAZOSIN HCL 5 MG PO CAPS: 5.0000 mg | ORAL_CAPSULE | Freq: Every day | ORAL | Status: DC

## 2014-04-23 MED ORDER — GABAPENTIN 300 MG PO CAPS: 300.0000 mg | ORAL_CAPSULE | Freq: Four times a day (QID) | ORAL | Status: DC

## 2014-04-23 MED ORDER — DULOXETINE HCL 60 MG PO CPEP: 60.0000 mg | ORAL_CAPSULE | Freq: Every day | ORAL | Status: DC

## 2014-04-23 MED ORDER — CHLORDIAZEPOXIDE HCL 10 MG PO CAPS: 10.0000 mg | ORAL_CAPSULE | Freq: Three times a day (TID) | ORAL | Status: DC | PRN

## 2014-04-23 MED ORDER — TRAZODONE HCL 100 MG PO TABS: 200.0000 mg | ORAL_TABLET | Freq: Every day | ORAL | Status: DC
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
## 2014-05-01 ENCOUNTER — Encounter (HOSPITAL_COMMUNITY): Payer: Self-pay | Admitting: Psychiatry

## 2014-05-08 ENCOUNTER — Ambulatory Visit (INDEPENDENT_AMBULATORY_CARE_PROVIDER_SITE_OTHER): Payer: 59 | Admitting: Psychology

## 2014-05-08 DIAGNOSIS — F250 Schizoaffective disorder, bipolar type: Secondary | ICD-10-CM

## 2014-05-08 DIAGNOSIS — F4310 Post-traumatic stress disorder, unspecified: Secondary | ICD-10-CM | POA: Diagnosis not present

## 2014-06-05 ENCOUNTER — Encounter (HOSPITAL_COMMUNITY): Payer: Self-pay | Admitting: Psychology

## 2014-06-05 ENCOUNTER — Ambulatory Visit (INDEPENDENT_AMBULATORY_CARE_PROVIDER_SITE_OTHER): Payer: 59 | Admitting: Psychology

## 2014-06-05 DIAGNOSIS — F250 Schizoaffective disorder, bipolar type: Secondary | ICD-10-CM

## 2014-06-05 DIAGNOSIS — F4310 Post-traumatic stress disorder, unspecified: Secondary | ICD-10-CM
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
## 2014-06-21 ENCOUNTER — Ambulatory Visit (HOSPITAL_COMMUNITY): Payer: Self-pay | Admitting: Psychiatry

## 2014-06-25 ENCOUNTER — Ambulatory Visit (HOSPITAL_COMMUNITY): Payer: Self-pay | Admitting: Psychiatry

## 2014-06-27 ENCOUNTER — Encounter (HOSPITAL_COMMUNITY): Payer: Self-pay | Admitting: Psychiatry

## 2014-06-27 ENCOUNTER — Ambulatory Visit (INDEPENDENT_AMBULATORY_CARE_PROVIDER_SITE_OTHER): Payer: 59 | Admitting: Psychiatry

## 2014-06-27 VITALS — BP 140/94 | HR 89 | Ht 74.0 in | Wt 197.0 lb

## 2014-06-27 DIAGNOSIS — F4310 Post-traumatic stress disorder, unspecified: Secondary | ICD-10-CM | POA: Diagnosis not present

## 2014-06-27 DIAGNOSIS — F1010 Alcohol abuse, uncomplicated: Secondary | ICD-10-CM | POA: Diagnosis not present

## 2014-06-27 DIAGNOSIS — F209 Schizophrenia, unspecified: Secondary | ICD-10-CM | POA: Diagnosis not present

## 2014-06-27 MED ORDER — GABAPENTIN 300 MG PO CAPS: 300.0000 mg | ORAL_CAPSULE | Freq: Four times a day (QID) | ORAL | Status: DC

## 2014-06-27 MED ORDER — CHLORDIAZEPOXIDE HCL 10 MG PO CAPS: 10.0000 mg | ORAL_CAPSULE | Freq: Three times a day (TID) | ORAL | Status: DC | PRN

## 2014-06-27 MED ORDER — DULOXETINE HCL 60 MG PO CPEP: 60.0000 mg | ORAL_CAPSULE | Freq: Every day | ORAL | Status: DC

## 2014-06-27 MED ORDER — PRAZOSIN HCL 5 MG PO CAPS: 5.0000 mg | ORAL_CAPSULE | Freq: Every day | ORAL | Status: DC

## 2014-06-27 MED ORDER — LURASIDONE HCL 120 MG PO TABS: 120.0000 mg | ORAL_TABLET | Freq: Every day | ORAL | Status: DC

## 2014-06-27 MED ORDER — TRAZODONE HCL 100 MG PO TABS: 200.0000 mg | ORAL_TABLET | Freq: Every day | ORAL | Status: DC
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
## 2014-07-09 ENCOUNTER — Ambulatory Visit (HOSPITAL_COMMUNITY): Payer: Self-pay | Admitting: Psychology

## 2014-07-09 ENCOUNTER — Encounter (HOSPITAL_COMMUNITY): Payer: Self-pay | Admitting: *Deleted

## 2014-08-02 ENCOUNTER — Ambulatory Visit (INDEPENDENT_AMBULATORY_CARE_PROVIDER_SITE_OTHER): Payer: 59 | Admitting: Psychology

## 2014-08-02 DIAGNOSIS — F250 Schizoaffective disorder, bipolar type: Secondary | ICD-10-CM | POA: Diagnosis not present

## 2014-08-02 DIAGNOSIS — F4310 Post-traumatic stress disorder, unspecified: Secondary | ICD-10-CM | POA: Diagnosis not present

## 2014-08-27 ENCOUNTER — Ambulatory Visit (INDEPENDENT_AMBULATORY_CARE_PROVIDER_SITE_OTHER): Payer: 59 | Admitting: Psychiatry

## 2014-08-27 ENCOUNTER — Encounter (HOSPITAL_COMMUNITY): Payer: Self-pay | Admitting: Psychiatry

## 2014-08-27 VITALS — BP 146/97 | HR 96 | Ht 74.0 in | Wt 195.2 lb

## 2014-08-27 DIAGNOSIS — F1010 Alcohol abuse, uncomplicated: Secondary | ICD-10-CM

## 2014-08-27 DIAGNOSIS — F4310 Post-traumatic stress disorder, unspecified: Secondary | ICD-10-CM | POA: Diagnosis not present

## 2014-08-27 DIAGNOSIS — F259 Schizoaffective disorder, unspecified: Secondary | ICD-10-CM | POA: Diagnosis not present

## 2014-08-27 MED ORDER — GABAPENTIN 300 MG PO CAPS: 300.0000 mg | ORAL_CAPSULE | Freq: Four times a day (QID) | ORAL | Status: DC

## 2014-08-27 MED ORDER — CHLORDIAZEPOXIDE HCL 10 MG PO CAPS
10.0000 mg | ORAL_CAPSULE | Freq: Three times a day (TID) | ORAL | Status: DC | PRN
Start: 2014-08-27 — End: 2014-11-27

## 2014-08-27 MED ORDER — LURASIDONE HCL 120 MG PO TABS: 120.0000 mg | ORAL_TABLET | Freq: Every day | ORAL | Status: DC

## 2014-08-27 MED ORDER — DULOXETINE HCL 60 MG PO CPEP: 60.0000 mg | ORAL_CAPSULE | Freq: Every day | ORAL | Status: DC

## 2014-08-27 MED ORDER — PRAZOSIN HCL 5 MG PO CAPS: 5.0000 mg | ORAL_CAPSULE | Freq: Every day | ORAL | Status: DC

## 2014-08-27 MED ORDER — TRAZODONE HCL 100 MG PO TABS: 200.0000 mg | ORAL_TABLET | Freq: Every day | ORAL | Status: DC
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
## 2014-08-29 ENCOUNTER — Telehealth (HOSPITAL_COMMUNITY): Payer: Self-pay | Admitting: *Deleted

## 2014-08-30 ENCOUNTER — Ambulatory Visit (HOSPITAL_COMMUNITY): Payer: Self-pay | Admitting: Psychology
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
## 2014-09-17 ENCOUNTER — Encounter: Payer: Self-pay | Admitting: Psychiatry

## 2014-09-23 ENCOUNTER — Other Ambulatory Visit (HOSPITAL_COMMUNITY): Payer: Self-pay | Admitting: Psychiatry

## 2014-10-18 ENCOUNTER — Ambulatory Visit (INDEPENDENT_AMBULATORY_CARE_PROVIDER_SITE_OTHER): Payer: 59 | Admitting: Psychology

## 2014-10-18 ENCOUNTER — Encounter (HOSPITAL_COMMUNITY): Payer: Self-pay | Admitting: Psychology

## 2014-10-18 DIAGNOSIS — F4310 Post-traumatic stress disorder, unspecified: Secondary | ICD-10-CM

## 2014-10-18 DIAGNOSIS — F250 Schizoaffective disorder, bipolar type: Secondary | ICD-10-CM
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
## 2014-11-27 ENCOUNTER — Encounter (HOSPITAL_COMMUNITY): Payer: Self-pay | Admitting: Psychiatry

## 2014-11-27 ENCOUNTER — Ambulatory Visit (INDEPENDENT_AMBULATORY_CARE_PROVIDER_SITE_OTHER): Payer: 59 | Admitting: Psychiatry

## 2014-11-27 VITALS — BP 139/92 | HR 92 | Ht 74.0 in | Wt 198.6 lb

## 2014-11-27 DIAGNOSIS — F4310 Post-traumatic stress disorder, unspecified: Secondary | ICD-10-CM | POA: Diagnosis not present

## 2014-11-27 DIAGNOSIS — F259 Schizoaffective disorder, unspecified: Secondary | ICD-10-CM | POA: Diagnosis not present

## 2014-11-27 DIAGNOSIS — F1010 Alcohol abuse, uncomplicated: Secondary | ICD-10-CM

## 2014-11-27 MED ORDER — TRAZODONE HCL 100 MG PO TABS: 200.0000 mg | ORAL_TABLET | Freq: Every day | ORAL | Status: DC

## 2014-11-27 MED ORDER — CHLORDIAZEPOXIDE HCL 10 MG PO CAPS: 10.0000 mg | ORAL_CAPSULE | Freq: Three times a day (TID) | ORAL | Status: DC | PRN

## 2014-11-27 MED ORDER — LURASIDONE HCL 120 MG PO TABS: 120.0000 mg | ORAL_TABLET | Freq: Every day | ORAL | Status: DC

## 2014-11-27 MED ORDER — PRAZOSIN HCL 5 MG PO CAPS: 5.0000 mg | ORAL_CAPSULE | Freq: Every day | ORAL | Status: DC

## 2014-11-27 MED ORDER — GABAPENTIN 300 MG PO CAPS: 300.0000 mg | ORAL_CAPSULE | Freq: Four times a day (QID) | ORAL | Status: DC

## 2014-11-27 MED ORDER — DULOXETINE HCL 60 MG PO CPEP: 60.0000 mg | ORAL_CAPSULE | Freq: Every day | ORAL | Status: DC
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
## 2014-12-19 ENCOUNTER — Ambulatory Visit (HOSPITAL_COMMUNITY): Payer: 59 | Admitting: Psychology

## 2015-01-17 ENCOUNTER — Ambulatory Visit (HOSPITAL_COMMUNITY): Payer: 59 | Admitting: Psychology

## 2015-01-28 ENCOUNTER — Ambulatory Visit (INDEPENDENT_AMBULATORY_CARE_PROVIDER_SITE_OTHER): Payer: 59 | Admitting: Neurology

## 2015-01-28 ENCOUNTER — Encounter: Payer: Self-pay | Admitting: Neurology

## 2015-01-28 VITALS — BP 147/101 | HR 70 | Ht 74.0 in | Wt 194.0 lb

## 2015-01-28 DIAGNOSIS — Z87828 Personal history of other (healed) physical injury and trauma: Secondary | ICD-10-CM | POA: Diagnosis not present

## 2015-01-28 DIAGNOSIS — F250 Schizoaffective disorder, bipolar type: Secondary | ICD-10-CM | POA: Diagnosis not present

## 2015-01-28 DIAGNOSIS — R569 Unspecified convulsions: Secondary | ICD-10-CM

## 2015-01-28 MED ORDER — LAMOTRIGINE ER 25 MG PO TB24: ORAL_TABLET | ORAL | Status: DC

## 2015-01-28 MED ORDER — LAMOTRIGINE ER 200 MG PO TB24: 200.0000 mg | ORAL_TABLET | Freq: Every day | ORAL | Status: DC
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
## 2015-02-03 ENCOUNTER — Telehealth: Payer: Self-pay | Admitting: Neurology

## 2015-02-03 NOTE — Telephone Encounter (Signed)
Patient's wife is calling and states that her husband's insurance [employer] Medicare Complete and Medicaid will not pay for his Rx Lamotrigine 25 mg or Lamotrigine XR 200 mg TB 24.  Please call.

## 2015-02-03 NOTE — Telephone Encounter (Signed)
I contacted Medicaid.  Spoke with [employer].  She said unfortunately, they are unable to process the request for coverage because the patient has Medicare Part D, and the pharmacy should be processing the Rx through them instead.  I contacted the pharmacy, who changed the claim from Medicaid to Medicare.  Medicare, however, does not cover this drug.  We have contacted them and provided clinical info, asking that they grant an exception.  Request is currently under review Ref # [id number]  Called back to advise.  Got no answer.  Left message.  [facility]([employer]) has approved the request for coverage on Lamotrigine ER (all strengths) effective until 03/28/2016 Ref # [id number].  They have notified the patient of this decision, and we have notified the pharmacy.

## 2015-02-11 ENCOUNTER — Ambulatory Visit (INDEPENDENT_AMBULATORY_CARE_PROVIDER_SITE_OTHER): Payer: 59 | Admitting: Psychology

## 2015-02-11 DIAGNOSIS — F250 Schizoaffective disorder, bipolar type: Secondary | ICD-10-CM | POA: Diagnosis not present

## 2015-02-11 DIAGNOSIS — F4310 Post-traumatic stress disorder, unspecified: Secondary | ICD-10-CM | POA: Diagnosis not present

## 2015-02-12 ENCOUNTER — Other Ambulatory Visit: Payer: 59

## 2015-02-17 ENCOUNTER — Encounter (HOSPITAL_COMMUNITY): Payer: Self-pay | Admitting: Psychology

## 2015-02-17 ENCOUNTER — Encounter (HOSPITAL_COMMUNITY): Payer: Self-pay | Admitting: Psychiatry

## 2015-02-17 ENCOUNTER — Ambulatory Visit (INDEPENDENT_AMBULATORY_CARE_PROVIDER_SITE_OTHER): Payer: 59 | Admitting: Psychiatry

## 2015-02-17 VITALS — BP 129/78 | HR 81 | Ht 74.0 in | Wt 192.2 lb

## 2015-02-17 DIAGNOSIS — F250 Schizoaffective disorder, bipolar type: Secondary | ICD-10-CM | POA: Diagnosis not present

## 2015-02-17 DIAGNOSIS — F1010 Alcohol abuse, uncomplicated: Secondary | ICD-10-CM | POA: Diagnosis not present

## 2015-02-17 DIAGNOSIS — F4310 Post-traumatic stress disorder, unspecified: Secondary | ICD-10-CM

## 2015-02-17 MED ORDER — DULOXETINE HCL 60 MG PO CPEP: 60.0000 mg | ORAL_CAPSULE | Freq: Every day | ORAL | Status: DC

## 2015-02-17 MED ORDER — PRAZOSIN HCL 5 MG PO CAPS: 5.0000 mg | ORAL_CAPSULE | Freq: Every day | ORAL | Status: DC

## 2015-02-17 MED ORDER — GABAPENTIN 300 MG PO CAPS: 300.0000 mg | ORAL_CAPSULE | Freq: Four times a day (QID) | ORAL | Status: DC

## 2015-02-17 MED ORDER — TRAZODONE HCL 100 MG PO TABS: 200.0000 mg | ORAL_TABLET | Freq: Every day | ORAL | Status: DC

## 2015-02-17 MED ORDER — LURASIDONE HCL 120 MG PO TABS: 120.0000 mg | ORAL_TABLET | Freq: Every day | ORAL | Status: DC

## 2015-02-17 MED ORDER — CHLORDIAZEPOXIDE HCL 10 MG PO CAPS: 10.0000 mg | ORAL_CAPSULE | Freq: Three times a day (TID) | ORAL | Status: DC | PRN
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
## 2015-02-23 ENCOUNTER — Encounter: Payer: Self-pay | Admitting: Neurology

## 2015-02-25 ENCOUNTER — Ambulatory Visit
Admission: RE | Admit: 2015-02-25 | Discharge: 2015-02-25 | Disposition: A | Discharge status: Home / Self Care | Payer: Medicare Other | Source: Ambulatory Visit | Attending: Neurology | Admitting: Neurology

## 2015-02-25 ENCOUNTER — Ambulatory Visit (HOSPITAL_COMMUNITY): Payer: Self-pay

## 2015-02-25 ENCOUNTER — Telehealth: Payer: Self-pay | Admitting: *Deleted

## 2015-02-25 DIAGNOSIS — R569 Unspecified convulsions: Secondary | ICD-10-CM

## 2015-02-25 DIAGNOSIS — Z87828 Personal history of other (healed) physical injury and trauma: Secondary | ICD-10-CM

## 2015-02-25 DIAGNOSIS — F250 Schizoaffective disorder, bipolar type: Secondary | ICD-10-CM

## 2015-02-25 MED ORDER — GADOBENATE DIMEGLUMINE 529 MG/ML IV SOLN
18.0000 mL | Freq: Once | INTRAVENOUS | Status: AC | PRN
  Administered 2015-02-25: 18 mL via INTRAVENOUS

## 2015-02-25 NOTE — Telephone Encounter (Addendum)
Pt's wife called sts he has not taken lamotrigine past 2 days and peeling is not as bad.  Please call and .

## 2015-02-25 NOTE — Telephone Encounter (Signed)
Please call patient, to check on the detail of his skin rash, to see if he has any rash in his trunk, or just peeling of the hand. If necessary, may bring him in for an early visit   He was given Lamictal since Jan 28 2015.

## 2015-02-25 NOTE — Telephone Encounter (Signed)
Says the skin on his fingers and hands started peeling after increasing his Lamictal to 75mg  at bedtime.  He has also noticed a loss of appetite.  Due to his side effects, he stopped the medication completely two days ago.  He would like to know how you would like to proceed with his treatment.  He had his MRI today and his EEG is pending on 03/14/15.  His follow up is scheduled for 04/01/15.

## 2015-02-25 NOTE — Telephone Encounter (Signed)
Patient expressing concern over peeling of the hands since taking 3 of the lamotrigine 25 mg.

## 2015-02-26 MED ORDER — DIVALPROEX SODIUM ER 500 MG PO TB24: 500.0000 mg | ORAL_TABLET | Freq: Every day | ORAL | Status: DC

## 2015-02-26 NOTE — Addendum Note (Signed)
Addended by: Levert FeinsteinYAN, Colletta Spillers on: 02/26/2015 08:50 AM   Modules accepted: Orders, Medications

## 2015-02-26 NOTE — Telephone Encounter (Addendum)
Please advise patient, it is okay to stop lamotrigine, I have called in Depakote ER 500 mg every night

## 2015-02-26 NOTE — Telephone Encounter (Signed)
He stopped his lamotrigine three days ago.  Instructed him to start the new medication and to call back with any concerns or questions.

## 2015-02-27 NOTE — Progress Notes (Signed)
Quick Note:  I released the normal MRI brain result to my chart ______

## 2015-03-14 ENCOUNTER — Ambulatory Visit (HOSPITAL_COMMUNITY): Payer: Self-pay

## 2015-03-14 ENCOUNTER — Ambulatory Visit (HOSPITAL_COMMUNITY)
Admission: RE | Admit: 2015-03-14 | Discharge: 2015-03-14 | Disposition: A | Discharge status: Home / Self Care | Payer: Medicare Other | Source: Ambulatory Visit | Attending: Neurology | Admitting: Neurology

## 2015-03-14 DIAGNOSIS — Z87828 Personal history of other (healed) physical injury and trauma: Secondary | ICD-10-CM | POA: Insufficient documentation

## 2015-03-14 DIAGNOSIS — R569 Unspecified convulsions: Secondary | ICD-10-CM

## 2015-03-14 DIAGNOSIS — F250 Schizoaffective disorder, bipolar type: Secondary | ICD-10-CM

## 2015-03-14 NOTE — Progress Notes (Signed)
EEG Completed; Results Pending  

## 2015-03-14 NOTE — Procedures (Signed)
ELECTROENCEPHALOGRAM REPORT  Date of Study: 03/14/2015  Patient's Name: Howard BeckersMichael S Bishop MRN: [id number] Date of Birth: [birth year]  Referring Provider: Dr. Levert FeinsteinYijun Yan  Clinical History: This is a 39 year old man with seizure-like activity.   Medications: chlordiazePOXIDE (LIBRIUM) 10 MG capsule divalproex (DEPAKOTE ER) 500 MG 24 hr tablet DULoxetine (CYMBALTA) 60 MG capsule gabapentin (NEURONTIN) 300 MG capsule lisinopril (PRINIVIL,ZESTRIL) 10 MG tablet Lurasidone HCl (LATUDA) 120 MG TABS prazosin (MINIPRESS) 5 MG capsule traZODone (DESYREL) 100 MG tablet  Technical Summary: A multichannel digital EEG recording measured by the international 10-20 system with electrodes applied with paste and impedances below 5000 ohms performed in our laboratory with EKG monitoring in an awake and drowsy patient.  Hyperventilation was not performed. Photic stimulation was performed.  The digital EEG was referentially recorded, reformatted, and digitally filtered in a variety of bipolar and referential montages for optimal display.    Description: The patient is awake and drowsy during the recording.  During maximal wakefulness, there is a symmetric, medium voltage 9 Hz posterior dominant rhythm that attenuates with eye opening.  The record is symmetric.  During drowsiness, there is an increase in theta slowing of the background. Deeper stages of sleep were not seen.  Photic stimulation did not elicit any abnormalities.  There were no epileptiform discharges or electrographic seizures seen.    EKG lead was unremarkable.  Impression: This awake and drowsy EEG is normal.    Clinical Correlation: A normal EEG does not exclude a clinical diagnosis of epilepsy. Clinical correlation is advised.   Patrcia DollyKaren Amiliana Foutz, M.D.

## 2015-04-01 ENCOUNTER — Encounter: Payer: Self-pay | Admitting: Neurology

## 2015-04-01 ENCOUNTER — Ambulatory Visit (INDEPENDENT_AMBULATORY_CARE_PROVIDER_SITE_OTHER): Payer: 59 | Admitting: Neurology

## 2015-04-01 VITALS — BP 145/94 | HR 80 | Ht 74.0 in | Wt 191.0 lb

## 2015-04-01 DIAGNOSIS — R569 Unspecified convulsions: Secondary | ICD-10-CM | POA: Insufficient documentation
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
## 2015-04-02 ENCOUNTER — Telehealth: Payer: Self-pay | Admitting: Neurology

## 2015-04-02 LAB — COMPREHENSIVE METABOLIC PANEL
ALT: 91 IU/L — ABNORMAL HIGH (ref 0–44)
AST: 50 IU/L — ABNORMAL HIGH (ref 0–40)
Albumin/Globulin Ratio: 1.8 (ref 1.1–2.5)
Albumin: 4.2 g/dL (ref 3.5–5.5)
Alkaline Phosphatase: 113 IU/L (ref 39–117)
BUN/Creatinine Ratio: 12 (ref 8–19)
BUN: 8 mg/dL (ref 6–20)
Bilirubin Total: 0.3 mg/dL (ref 0.0–1.2)
CO2: 18 mmol/L (ref 18–29)
Calcium: 9.9 mg/dL (ref 8.7–10.2)
Chloride: 101 mmol/L (ref 96–106)
Creatinine, Ser: 0.65 mg/dL — ABNORMAL LOW (ref 0.76–1.27)
GFR calc Af Amer: 142 mL/min/{1.73_m2} (ref >59)
GFR calc non Af Amer: 123 mL/min/{1.73_m2} (ref >59)
Globulin, Total: 2.3 g/dL (ref 1.5–4.5)
Glucose: 90 mg/dL (ref 65–99)
Potassium: 4.6 mmol/L (ref 3.5–5.2)
Sodium: 140 mmol/L (ref 134–144)
Total Protein: 6.5 g/dL (ref 6.0–8.5)

## 2015-04-02 LAB — CBC
Hematocrit: 52.4 % — ABNORMAL HIGH (ref 37.5–51.0)
Hemoglobin: 17.6 g/dL (ref 12.6–17.7)
MCH: 30.8 pg (ref 26.6–33.0)
MCHC: 33.6 g/dL (ref 31.5–35.7)
MCV: 92 fL (ref 79–97)
Platelets: 241 10*3/uL (ref 150–379)
RBC: 5.71 x10E6/uL (ref 4.14–5.80)
RDW: 14.4 % (ref 12.3–15.4)
WBC: 7.9 10*3/uL (ref 3.4–10.8)

## 2015-04-02 LAB — VALPROIC ACID LEVEL: Valproic Acid Lvl: <4 ug/mL — ABNORMAL LOW (ref 50–100)

## 2015-04-02 NOTE — Telephone Encounter (Signed)
Please call patient, laboratory showed mild abnormal liver functional tests, elevated ALT, AST, Depakote level is less than 4  Please make sure the patient is compliant with his medications

## 2015-04-03 ENCOUNTER — Ambulatory Visit (INDEPENDENT_AMBULATORY_CARE_PROVIDER_SITE_OTHER): Payer: Medicare Other | Admitting: Psychology

## 2015-04-03 DIAGNOSIS — F250 Schizoaffective disorder, bipolar type: Secondary | ICD-10-CM

## 2015-04-03 DIAGNOSIS — F4310 Post-traumatic stress disorder, unspecified: Secondary | ICD-10-CM | POA: Diagnosis not present

## 2015-04-17 ENCOUNTER — Encounter (HOSPITAL_COMMUNITY): Payer: Self-pay | Admitting: Psychology
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
## 2015-05-06 ENCOUNTER — Ambulatory Visit (INDEPENDENT_AMBULATORY_CARE_PROVIDER_SITE_OTHER): Payer: Medicare Other | Admitting: Psychology

## 2015-05-06 DIAGNOSIS — F4310 Post-traumatic stress disorder, unspecified: Secondary | ICD-10-CM | POA: Diagnosis not present

## 2015-05-06 DIAGNOSIS — F250 Schizoaffective disorder, bipolar type: Secondary | ICD-10-CM

## 2015-05-20 ENCOUNTER — Ambulatory Visit (INDEPENDENT_AMBULATORY_CARE_PROVIDER_SITE_OTHER): Payer: Medicare Other | Admitting: Psychiatry

## 2015-05-20 ENCOUNTER — Encounter (HOSPITAL_COMMUNITY): Payer: Self-pay | Admitting: Psychiatry

## 2015-05-20 VITALS — BP 141/89 | HR 84 | Ht 74.0 in | Wt 180.6 lb

## 2015-05-20 DIAGNOSIS — F1010 Alcohol abuse, uncomplicated: Secondary | ICD-10-CM

## 2015-05-20 DIAGNOSIS — F259 Schizoaffective disorder, unspecified: Secondary | ICD-10-CM | POA: Diagnosis not present

## 2015-05-20 DIAGNOSIS — F4310 Post-traumatic stress disorder, unspecified: Secondary | ICD-10-CM

## 2015-05-20 MED ORDER — GABAPENTIN 300 MG PO CAPS: 300.0000 mg | ORAL_CAPSULE | Freq: Four times a day (QID) | ORAL | Status: DC

## 2015-05-20 MED ORDER — PRAZOSIN HCL 5 MG PO CAPS: 5.0000 mg | ORAL_CAPSULE | Freq: Every day | ORAL | Status: DC

## 2015-05-20 MED ORDER — TRAZODONE HCL 100 MG PO TABS: 200.0000 mg | ORAL_TABLET | Freq: Every day | ORAL | Status: DC

## 2015-05-20 MED ORDER — CHLORDIAZEPOXIDE HCL 10 MG PO CAPS: 10.0000 mg | ORAL_CAPSULE | Freq: Three times a day (TID) | ORAL | Status: DC | PRN

## 2015-05-20 MED ORDER — DULOXETINE HCL 60 MG PO CPEP: 60.0000 mg | ORAL_CAPSULE | Freq: Every day | ORAL | Status: DC
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
## 2015-06-10 ENCOUNTER — Ambulatory Visit (HOSPITAL_COMMUNITY): Payer: Self-pay | Admitting: Psychology

## 2015-06-10 ENCOUNTER — Encounter (HOSPITAL_COMMUNITY): Payer: Self-pay | Admitting: Psychology

## 2015-07-15 ENCOUNTER — Ambulatory Visit (INDEPENDENT_AMBULATORY_CARE_PROVIDER_SITE_OTHER): Payer: BLUE CROSS/BLUE SHIELD | Admitting: Psychology

## 2015-07-15 ENCOUNTER — Encounter (HOSPITAL_COMMUNITY): Payer: Self-pay | Admitting: Psychology

## 2015-07-15 DIAGNOSIS — F4310 Post-traumatic stress disorder, unspecified: Secondary | ICD-10-CM | POA: Diagnosis not present

## 2015-07-15 DIAGNOSIS — F250 Schizoaffective disorder, bipolar type: Secondary | ICD-10-CM
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
## 2015-07-18 ENCOUNTER — Ambulatory Visit (HOSPITAL_COMMUNITY): Payer: Self-pay | Admitting: Psychiatry

## 2015-07-18 ENCOUNTER — Other Ambulatory Visit (HOSPITAL_COMMUNITY): Payer: Self-pay | Admitting: Psychiatry

## 2015-07-29 ENCOUNTER — Encounter (HOSPITAL_COMMUNITY): Payer: Self-pay | Admitting: *Deleted

## 2015-07-29 ENCOUNTER — Ambulatory Visit (HOSPITAL_COMMUNITY): Payer: Self-pay | Admitting: Psychiatry

## 2015-07-31 ENCOUNTER — Telehealth (HOSPITAL_COMMUNITY): Payer: Self-pay | Admitting: *Deleted

## 2015-08-14 ENCOUNTER — Ambulatory Visit (HOSPITAL_COMMUNITY): Payer: Self-pay | Admitting: Psychology

## 2015-09-03 ENCOUNTER — Encounter (HOSPITAL_COMMUNITY): Payer: Self-pay | Admitting: Psychology
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
## 2015-10-02 ENCOUNTER — Ambulatory Visit: Payer: 59 | Admitting: Adult Health

## 2015-11-06 ENCOUNTER — Ambulatory Visit: Payer: 59 | Admitting: Adult Health

## 2015-11-06 ENCOUNTER — Telehealth: Payer: Self-pay | Admitting: *Deleted

## 2015-11-06 NOTE — Telephone Encounter (Signed)
No showed follow up appointment. 

## 2015-11-07 ENCOUNTER — Encounter: Payer: Self-pay | Admitting: Adult Health

## 2016-08-31 ENCOUNTER — Emergency Department (HOSPITAL_COMMUNITY): Payer: Self-pay

## 2016-08-31 ENCOUNTER — Emergency Department (HOSPITAL_COMMUNITY)
Admission: EM | Admit: 2016-08-31 | Discharge: 2016-08-31 | Disposition: A | Discharge status: Home / Self Care | Payer: Self-pay | Attending: Emergency Medicine | Admitting: Emergency Medicine

## 2016-08-31 ENCOUNTER — Encounter (HOSPITAL_COMMUNITY): Payer: Self-pay

## 2016-08-31 DIAGNOSIS — Y939 Activity, unspecified: Secondary | ICD-10-CM | POA: Insufficient documentation

## 2016-08-31 DIAGNOSIS — M25511 Pain in right shoulder: Secondary | ICD-10-CM | POA: Insufficient documentation

## 2016-08-31 DIAGNOSIS — Y999 Unspecified external cause status: Secondary | ICD-10-CM | POA: Insufficient documentation

## 2016-08-31 DIAGNOSIS — W19XXXA Unspecified fall, initial encounter: Secondary | ICD-10-CM

## 2016-08-31 DIAGNOSIS — N189 Chronic kidney disease, unspecified: Secondary | ICD-10-CM | POA: Insufficient documentation

## 2016-08-31 DIAGNOSIS — Z79899 Other long term (current) drug therapy: Secondary | ICD-10-CM | POA: Insufficient documentation

## 2016-08-31 DIAGNOSIS — Y929 Unspecified place or not applicable: Secondary | ICD-10-CM | POA: Insufficient documentation

## 2016-08-31 DIAGNOSIS — F17210 Nicotine dependence, cigarettes, uncomplicated: Secondary | ICD-10-CM | POA: Insufficient documentation

## 2016-08-31 DIAGNOSIS — W109XXA Fall (on) (from) unspecified stairs and steps, initial encounter: Secondary | ICD-10-CM | POA: Insufficient documentation

## 2016-08-31 DIAGNOSIS — I129 Hypertensive chronic kidney disease with stage 1 through stage 4 chronic kidney disease, or unspecified chronic kidney disease: Secondary | ICD-10-CM | POA: Insufficient documentation

## 2016-08-31 MED ORDER — MELOXICAM 15 MG PO TABS: 15.0000 mg | ORAL_TABLET | Freq: Every day | ORAL | 0 refills | Status: DC

## 2016-08-31 MED ORDER — IBUPROFEN 800 MG PO TABS
800.0000 mg | ORAL_TABLET | Freq: Once | ORAL | Status: AC
  Administered 2016-08-31: 800 mg via ORAL
  Filled 2016-08-31: qty 1

## 2016-08-31 MED ORDER — LIDOCAINE 5 % EX PTCH: 1.0000 | MEDICATED_PATCH | CUTANEOUS | 0 refills | Status: DC

## 2016-08-31 NOTE — ED Provider Notes (Signed)
[facility]-EMERGENCY DEPT Provider Note   CSN: [id number] Arrival date & time: 08/31/16  1602     History   Chief Complaint Chief Complaint  Patient presents with  . Shoulder Pain    HPI Howard Bishop is a 41 y.o. male.  The history is provided by the patient.  Shoulder Pain   This is a new problem. Episode onset: 1 week ago. The problem occurs constantly. The problem has not changed since onset.The pain is present in the left shoulder. The quality of the pain is described as aching. The pain is at a severity of 8/10. The pain is moderate. Associated symptoms include limited range of motion. Pertinent negatives include no numbness, no stiffness, no tingling and no itching. The symptoms are aggravated by contact. He has tried OTC pain medications, cold and rest for the symptoms. The treatment provided no relief. There has been a history of trauma. Family history is significant for no gout.   Patient here with complaint of right shoulder pain. He had an injury that occurred about one week ago and has had pain since that time. Patient states that he tripped over his dog and fell off the stairs on to the right shoulder. His pain has been constant since that time. He has pain with abduction of the shoulder. Patient states haven't takes care of his 934-month-old and states has been difficult to hold the baby. He denies any numbness or tingling in the hand, no weakness, no previous history of injury.  Past Medical History:  Diagnosis Date  . ADHD (attention deficit hyperactivity disorder)   . Anxiety   . Bipolar disorder (HCC)   . Chronic kidney disease   . Gallstone   . Headache(784.0)   . Hepatitis C antibody test positive   . Hx of concussion   . Hypertension   . PTSD (post-traumatic stress disorder)   . Schizoaffective disorder (HCC)   . Seizures [facility](HCC)     Patient Active Problem List   Diagnosis Date Noted  . Seizures (HCC) 04/01/2015  . Alcohol abuse 01/18/2014  . 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 PTSD (post-traumatic stress disorder)   . Hepatitis C antibody test positive   . JXBJYNWG(956.2Headache(784.0)     Past Surgical History:  Procedure Laterality Date  . RENAL ARTERY STENT         Home Medications    Prior to Admission medications   Medication Sig Start Date End Date Taking? Authorizing Provider  chlordiazePOXIDE (LIBRIUM) 10 MG capsule Take 1 capsule (10 mg total) by mouth 3 (three) times daily as needed for anxiety. 05/20/15   Myrlene Brokeross, Deborah R, MD  divalproex (DEPAKOTE ER) 500 MG 24 hr tablet Take 1 tablet (500 mg total) by mouth at bedtime. 02/26/15   Levert FeinsteinYan, Yijun, MD  DULoxetine (CYMBALTA) 60 MG capsule Take 1 capsule (60 mg total) by mouth daily. For depression 05/20/15   Myrlene Brokeross, Deborah R, MD  gabapentin (NEURONTIN) 300 MG capsule Take 1 capsule (300 mg total) by mouth 4 (four) times daily. 05/20/15   Myrlene Brokeross, Deborah R, MD  lisinopril (PRINIVIL,ZESTRIL) 10 MG tablet Take 1 tablet (10 mg total) by mouth daily. For high blood pressure 01/21/14   Adonis BrookAgustin, Sheila, NP  prazosin (MINIPRESS) 5 MG capsule Take 1 capsule (5 mg total) by mouth at bedtime. For nightmares 05/20/15   Myrlene Brokeross, Deborah R, MD  traZODone (DESYREL) 100 MG tablet Take 2 tablets (200 mg total) by mouth at bedtime. For sleep/insomnia  05/20/15   Myrlene Broker, MD    Family History Family History  Problem Relation Age of Onset  . Drug abuse Mother   . Alcohol abuse Father   . Anxiety disorder Sister   . Drug abuse Sister     Social History Social History  Substance Use Topics  . Smoking status: Current Every Day Smoker    Packs/day: 1.00    Types: Cigarettes    Start date: 03/29/1990  . Smokeless tobacco: Former Neurosurgeon    Types: Chew  . Alcohol use No     Allergies   Asa [aspirin]; Penicillins; Tomato; Ambien [zolpidem]; and Ultram [tramadol]   Review of Systems Review of Systems  Musculoskeletal: Positive for arthralgias and myalgias. Negative for  stiffness.  Skin: Negative for itching, rash and wound.  Neurological: Negative for tingling, weakness and numbness.     Physical Exam Updated Vital Signs BP (!) 170/86 (BP Location: Left Arm)   Pulse 93   Temp 98 F (36.7 C) (Oral)   Resp 18   Wt 81.6 kg (180 lb)   SpO2 99%   BMI 23.11 kg/m   Physical Exam  Constitutional: He appears well-developed and well-nourished. No distress.  HENT:  Head: Normocephalic and atraumatic.  Eyes: Conjunctivae are normal. No scleral icterus.  Neck: Normal range of motion. Neck supple.  Cardiovascular: Normal rate, regular rhythm and normal heart sounds.   Pulmonary/Chest: Effort normal and breath sounds normal. No respiratory distress.  Abdominal: Soft. There is no tenderness.  Musculoskeletal: He exhibits no edema.  Right shoulder with tenderness to palpation of the deltoid and trapezius. Full passive range of motion, although patient guards the joint. He has pain with active abduction of the shoulder. Normal pulse and sensation  Neurological: He is alert.  Skin: Skin is warm and dry. He is not diaphoretic.  Psychiatric: His behavior is normal.  Nursing note and vitals reviewed.    ED Treatments / Results  Labs (all labs ordered are listed, but only abnormal results are displayed) Labs Reviewed - No data to display  EKG  EKG Interpretation None       Radiology Dg Shoulder Right  Result Date: 08/31/2016 CLINICAL DATA:  Right shoulder pain after fall 1 week ago going up stairs. EXAM: RIGHT SHOULDER - 2+ VIEW COMPARISON:  None. FINDINGS: There is no evidence of fracture or dislocation. There is no evidence of arthropathy or other focal bone abnormality. Soft tissues are unremarkable. IMPRESSION: Negative. Electronically Signed   By: Elberta Fortis M.D.   On: 08/31/2016 17:36    Procedures Procedures (including critical care time)  Medications Ordered in ED Medications  ibuprofen (ADVIL,MOTRIN) tablet 800 mg (800 mg Oral Given  08/31/16 1854)     Initial Impression / Assessment and Plan / ED Course  I have reviewed the triage vital signs and the nursing notes.  Pertinent labs & imaging results that were available during my care of the patient were reviewed by me and considered in my medical decision making (see chart for details).  Clinical Course as of Aug 31 1899  Tue Aug 31, 2016  1840 No controlled substances in the [place]  [AH]    Clinical Course User Index [AH] Arthor Captain, PA-C    Patient X-Ray negative for obvious fracture or dislocation. Pain managed in ED. Pt advised to follow up with orthopedics if symptoms persist for possibility of missed fracture diagnosis. Patient given brace while in ED, conservative therapy recommended and discussed. Patient will be  dc home & is agreeable with above plan.   Final Clinical Impressions(s) / ED Diagnoses   Final diagnoses:  Fall    New Prescriptions New Prescriptions   No medications on file     Arthor Captain, PA-C 08/31/16 1919    Loren Racer, MD 09/02/16 1504

## 2016-08-31 NOTE — Discharge Instructions (Signed)
Contact a health care provider if: °Your pain gets worse. °Your pain is not relieved with medicines. °New pain develops in your arm, hand, or fingers. °Get help right away if: °Your arm, hand, or fingers: °Tingle. °Become numb. °Become swollen. °Become painful. °Turn white or blue. °

## 2016-08-31 NOTE — ED Triage Notes (Signed)
Pt reports that he was knocked down 4 steps approx a week ago by his dog. Complaining of right shoulder pain that extends up in neck

## 2016-10-10 ENCOUNTER — Encounter (HOSPITAL_COMMUNITY): Payer: Self-pay | Admitting: Emergency Medicine

## 2016-10-10 ENCOUNTER — Emergency Department (HOSPITAL_COMMUNITY)
Admission: EM | Admit: 2016-10-10 | Discharge: 2016-10-10 | Disposition: A | Discharge status: Home / Self Care | Payer: BLUE CROSS/BLUE SHIELD | Attending: Emergency Medicine | Admitting: Emergency Medicine

## 2016-10-10 DIAGNOSIS — Z79899 Other long term (current) drug therapy: Secondary | ICD-10-CM | POA: Diagnosis not present

## 2016-10-10 DIAGNOSIS — Y92410 Unspecified street and highway as the place of occurrence of the external cause: Secondary | ICD-10-CM | POA: Insufficient documentation

## 2016-10-10 DIAGNOSIS — M542 Cervicalgia: Secondary | ICD-10-CM | POA: Insufficient documentation

## 2016-10-10 DIAGNOSIS — F17210 Nicotine dependence, cigarettes, uncomplicated: Secondary | ICD-10-CM | POA: Insufficient documentation

## 2016-10-10 DIAGNOSIS — F909 Attention-deficit hyperactivity disorder, unspecified type: Secondary | ICD-10-CM | POA: Diagnosis not present

## 2016-10-10 DIAGNOSIS — Y9389 Activity, other specified: Secondary | ICD-10-CM | POA: Insufficient documentation

## 2016-10-10 DIAGNOSIS — N189 Chronic kidney disease, unspecified: Secondary | ICD-10-CM | POA: Diagnosis not present

## 2016-10-10 DIAGNOSIS — I129 Hypertensive chronic kidney disease with stage 1 through stage 4 chronic kidney disease, or unspecified chronic kidney disease: Secondary | ICD-10-CM | POA: Insufficient documentation

## 2016-10-10 DIAGNOSIS — Y999 Unspecified external cause status: Secondary | ICD-10-CM | POA: Diagnosis not present

## 2016-10-10 MED ORDER — LIDOCAINE 5 % EX PTCH: 1.0000 | MEDICATED_PATCH | CUTANEOUS | 0 refills | Status: DC

## 2016-10-10 MED ORDER — METHOCARBAMOL 500 MG PO TABS: 500.0000 mg | ORAL_TABLET | Freq: Two times a day (BID) | ORAL | 0 refills | Status: DC
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
## 2016-10-10 NOTE — Discharge Instructions (Signed)
Expect your soreness to increase over the next 2-3 days. Take it easy, but do not lay around too much as this may make any stiffness worse.  °Antiinflammatory medications: Take 600 mg of ibuprofen every 6 hours or 440 mg (over the counter dose) to 500 mg (prescription dose) of naproxen every 12 hours or for the next 3 days. After this time, these medications may be used as needed for pain. Take these medications with food to avoid upset stomach. Choose only one of these medications, do not take them together.  °Tylenol: Should you continue to have additional pain while taking the ibuprofen or naproxen, you may add in tylenol as needed. Your daily total maximum amount of tylenol from all sources should be limited to 4000mg/day for persons without liver problems, or 2000mg/day for those with liver problems. °Muscle relaxer: Robaxin is a muscle relaxer and may help loosen stiff muscles. Do not take the Robaxin while driving or performing other dangerous activities.  °Lidocaine patches: These are available via either prescription or over-the-counter. The over-the-counter option may be more economical one and are likely just as effective. There are multiple over-the-counter brands, such as Salonpas. °Exercises: Be sure to perform the attached exercises starting with three times a week and working up to performing them daily. This is an essential part of preventing long term problems.  ° °Follow up with a primary care provider for any future management of these complaints. °

## 2017-01-11 ENCOUNTER — Emergency Department (HOSPITAL_COMMUNITY)
Admission: EM | Admit: 2017-01-11 | Discharge: 2017-01-11 | Disposition: A | Discharge status: Home / Self Care | Payer: BLUE CROSS/BLUE SHIELD | Attending: Emergency Medicine | Admitting: Emergency Medicine

## 2017-01-11 ENCOUNTER — Encounter (HOSPITAL_COMMUNITY): Payer: Self-pay | Admitting: *Deleted

## 2017-01-11 DIAGNOSIS — F909 Attention-deficit hyperactivity disorder, unspecified type: Secondary | ICD-10-CM | POA: Diagnosis not present

## 2017-01-11 DIAGNOSIS — N189 Chronic kidney disease, unspecified: Secondary | ICD-10-CM | POA: Insufficient documentation

## 2017-01-11 DIAGNOSIS — F17210 Nicotine dependence, cigarettes, uncomplicated: Secondary | ICD-10-CM | POA: Diagnosis not present

## 2017-01-11 DIAGNOSIS — I129 Hypertensive chronic kidney disease with stage 1 through stage 4 chronic kidney disease, or unspecified chronic kidney disease: Secondary | ICD-10-CM | POA: Diagnosis not present

## 2017-01-11 DIAGNOSIS — F419 Anxiety disorder, unspecified: Secondary | ICD-10-CM | POA: Diagnosis present

## 2017-01-11 DIAGNOSIS — F10230 Alcohol dependence with withdrawal, uncomplicated: Secondary | ICD-10-CM | POA: Diagnosis not present

## 2017-01-11 LAB — COMPREHENSIVE METABOLIC PANEL
ALT: 71 U/L — ABNORMAL HIGH (ref 17–63)
AST: 37 U/L (ref 15–41)
Albumin: 4.6 g/dL (ref 3.5–5.0)
Alkaline Phosphatase: 93 U/L (ref 38–126)
Anion gap: 12 (ref 5–15)
BUN: 7 mg/dL (ref 6–20)
CO2: 23 mmol/L (ref 22–32)
Calcium: 10 mg/dL (ref 8.9–10.3)
Chloride: 104 mmol/L (ref 101–111)
Creatinine, Ser: 0.87 mg/dL (ref 0.61–1.24)
GFR calc Af Amer: >60 mL/min (ref >60)
GFR calc non Af Amer: >60 mL/min (ref >60)
Glucose, Bld: 103 mg/dL — ABNORMAL HIGH (ref 65–99)
Potassium: 4.3 mmol/L (ref 3.5–5.1)
Sodium: 139 mmol/L (ref 135–145)
Total Bilirubin: 0.5 mg/dL (ref 0.3–1.2)
Total Protein: 7.3 g/dL (ref 6.5–8.1)

## 2017-01-11 LAB — CBC WITH DIFFERENTIAL/PLATELET
Basophils Absolute: 0 10*3/uL (ref 0.0–0.1)
Basophils Relative: 0 %
Eosinophils Absolute: 0.2 10*3/uL (ref 0.0–0.7)
Eosinophils Relative: 3 %
HCT: 44.9 % (ref 39.0–52.0)
Hemoglobin: 15.8 g/dL (ref 13.0–17.0)
Lymphocytes Relative: 38 %
Lymphs Abs: 2.6 10*3/uL (ref 0.7–4.0)
MCH: 31.9 pg (ref 26.0–34.0)
MCHC: 35.2 g/dL (ref 30.0–36.0)
MCV: 90.5 fL (ref 78.0–100.0)
Monocytes Absolute: 0.4 10*3/uL (ref 0.1–1.0)
Monocytes Relative: 6 %
Neutro Abs: 3.7 10*3/uL (ref 1.7–7.7)
Neutrophils Relative %: 53 %
Platelets: 242 10*3/uL (ref 150–400)
RBC: 4.96 MIL/uL (ref 4.22–5.81)
RDW: 12.6 % (ref 11.5–15.5)
WBC: 6.9 10*3/uL (ref 4.0–10.5)

## 2017-01-11 LAB — MAGNESIUM: Magnesium: 1.8 mg/dL (ref 1.7–2.4)

## 2017-01-11 LAB — ETHANOL: Alcohol, Ethyl (B): 102 mg/dL — ABNORMAL HIGH (ref <10)

## 2017-01-11 MED ORDER — SODIUM CHLORIDE 0.9 % IV BOLUS (SEPSIS)
1000.0000 mL | Freq: Once | INTRAVENOUS | Status: AC
  Administered 2017-01-11: 1000 mL via INTRAVENOUS

## 2017-01-11 MED ORDER — LORAZEPAM 1 MG PO TABS: 0.0000 mg | ORAL_TABLET | Freq: Four times a day (QID) | ORAL | Status: DC

## 2017-01-11 MED ORDER — LORAZEPAM 2 MG/ML IJ SOLN: 0.0000 mg | Freq: Four times a day (QID) | INTRAMUSCULAR | Status: DC

## 2017-01-11 MED ORDER — CHLORDIAZEPOXIDE HCL 25 MG PO CAPS
75.0000 mg | ORAL_CAPSULE | Freq: Once | ORAL | Status: DC
  Administered 2017-01-11: 75 mg via ORAL
  Filled 2017-01-11: qty 3

## 2017-01-11 MED ORDER — LORAZEPAM 2 MG/ML IJ SOLN: 0.0000 mg | Freq: Two times a day (BID) | INTRAMUSCULAR | Status: DC

## 2017-01-11 MED ORDER — LORAZEPAM 1 MG PO TABS: 0.0000 mg | ORAL_TABLET | Freq: Two times a day (BID) | ORAL | Status: DC

## 2017-01-11 MED ORDER — CHLORDIAZEPOXIDE HCL 25 MG PO CAPS: ORAL_CAPSULE | ORAL | 0 refills | Status: DC

## 2017-01-11 NOTE — Discharge Instructions (Signed)
Attached are resources for substance abuse counseling and detox facility. Please call to arrange a place to go.   Take medications as prescribed for alcohol withdrawal symptoms.  Please return without fail for worsening symptoms, including seizures, intractable vomiting, confusions, feeling unsafe, or any other symptoms concerning to you.
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
## 2017-01-11 NOTE — ED Notes (Signed)
Attempted x 2 for IV access without success.  

## 2017-01-11 NOTE — ED Triage Notes (Signed)
Pt states "I want help with my alcoholism". Last drink of alcohol was at 1500 today.

## 2017-01-12 ENCOUNTER — Encounter (HOSPITAL_COMMUNITY): Payer: Self-pay | Admitting: Emergency Medicine

## 2017-01-12 ENCOUNTER — Emergency Department (HOSPITAL_COMMUNITY)
Admission: EM | Admit: 2017-01-12 | Discharge: 2017-01-13 | Disposition: A | Discharge status: Other Acute Inpatient Hospital | Payer: BLUE CROSS/BLUE SHIELD | Attending: Emergency Medicine | Admitting: Emergency Medicine

## 2017-01-12 DIAGNOSIS — F1010 Alcohol abuse, uncomplicated: Secondary | ICD-10-CM | POA: Insufficient documentation

## 2017-01-12 DIAGNOSIS — F329 Major depressive disorder, single episode, unspecified: Secondary | ICD-10-CM | POA: Diagnosis present

## 2017-01-12 DIAGNOSIS — R45851 Suicidal ideations: Secondary | ICD-10-CM | POA: Diagnosis not present

## 2017-01-12 DIAGNOSIS — Z046 Encounter for general psychiatric examination, requested by authority: Secondary | ICD-10-CM | POA: Diagnosis not present

## 2017-01-12 DIAGNOSIS — Z9114 Patient's other noncompliance with medication regimen: Secondary | ICD-10-CM | POA: Insufficient documentation

## 2017-01-12 DIAGNOSIS — I129 Hypertensive chronic kidney disease with stage 1 through stage 4 chronic kidney disease, or unspecified chronic kidney disease: Secondary | ICD-10-CM | POA: Diagnosis not present

## 2017-01-12 DIAGNOSIS — F17210 Nicotine dependence, cigarettes, uncomplicated: Secondary | ICD-10-CM | POA: Diagnosis not present

## 2017-01-12 DIAGNOSIS — N189 Chronic kidney disease, unspecified: Secondary | ICD-10-CM | POA: Insufficient documentation

## 2017-01-12 LAB — COMPREHENSIVE METABOLIC PANEL
ALT: 59 U/L (ref 17–63)
AST: 29 U/L (ref 15–41)
Albumin: 4.4 g/dL (ref 3.5–5.0)
Alkaline Phosphatase: 81 U/L (ref 38–126)
Anion gap: 10 (ref 5–15)
BUN: 7 mg/dL (ref 6–20)
CO2: 22 mmol/L (ref 22–32)
Calcium: 10 mg/dL (ref 8.9–10.3)
Chloride: 107 mmol/L (ref 101–111)
Creatinine, Ser: 0.8 mg/dL (ref 0.61–1.24)
GFR calc Af Amer: >60 mL/min (ref >60)
GFR calc non Af Amer: >60 mL/min (ref >60)
Glucose, Bld: 92 mg/dL (ref 65–99)
Potassium: 4 mmol/L (ref 3.5–5.1)
Sodium: 139 mmol/L (ref 135–145)
Total Bilirubin: 0.5 mg/dL (ref 0.3–1.2)
Total Protein: 7.3 g/dL (ref 6.5–8.1)

## 2017-01-12 LAB — RAPID URINE DRUG SCREEN, HOSP PERFORMED
Amphetamines: NOT DETECTED
Barbiturates: NOT DETECTED
Benzodiazepines: POSITIVE — AB
Cocaine: NOT DETECTED
Opiates: NOT DETECTED
Tetrahydrocannabinol: POSITIVE — AB

## 2017-01-12 LAB — CBC WITH DIFFERENTIAL/PLATELET
Basophils Absolute: 0 10*3/uL (ref 0.0–0.1)
Basophils Relative: 0 %
Eosinophils Absolute: 0.2 10*3/uL (ref 0.0–0.7)
Eosinophils Relative: 2 %
HCT: 42.7 % (ref 39.0–52.0)
Hemoglobin: 15.1 g/dL (ref 13.0–17.0)
Lymphocytes Relative: 31 %
Lymphs Abs: 2.3 10*3/uL (ref 0.7–4.0)
MCH: 32.3 pg (ref 26.0–34.0)
MCHC: 35.4 g/dL (ref 30.0–36.0)
MCV: 91.2 fL (ref 78.0–100.0)
Monocytes Absolute: 0.4 10*3/uL (ref 0.1–1.0)
Monocytes Relative: 6 %
Neutro Abs: 4.5 10*3/uL (ref 1.7–7.7)
Neutrophils Relative %: 61 %
Platelets: 233 10*3/uL (ref 150–400)
RBC: 4.68 MIL/uL (ref 4.22–5.81)
RDW: 12.8 % (ref 11.5–15.5)
WBC: 7.3 10*3/uL (ref 4.0–10.5)

## 2017-01-12 LAB — ETHANOL: Alcohol, Ethyl (B): 44 mg/dL — ABNORMAL HIGH (ref <10)

## 2017-01-12 MED ORDER — LORAZEPAM 1 MG PO TABS
0.0000 mg | ORAL_TABLET | Freq: Four times a day (QID) | ORAL | Status: DC
  Administered 2017-01-12 (×2): 2 mg via ORAL
  Filled 2017-01-12 (×2): qty 2

## 2017-01-12 MED ORDER — THIAMINE HCL 100 MG/ML IJ SOLN: 100.0000 mg | Freq: Every day | INTRAMUSCULAR | Status: DC

## 2017-01-12 MED ORDER — LORAZEPAM 2 MG/ML IJ SOLN: 0.0000 mg | Freq: Four times a day (QID) | INTRAMUSCULAR | Status: DC

## 2017-01-12 MED ORDER — LORAZEPAM 2 MG/ML IJ SOLN: 0.0000 mg | Freq: Two times a day (BID) | INTRAMUSCULAR | Status: DC

## 2017-01-12 MED ORDER — VITAMIN B-1 100 MG PO TABS
100.0000 mg | ORAL_TABLET | Freq: Every day | ORAL | Status: DC
  Administered 2017-01-12: 100 mg via ORAL
  Filled 2017-01-12: qty 1

## 2017-01-12 MED ORDER — LORAZEPAM 1 MG PO TABS: 0.0000 mg | ORAL_TABLET | Freq: Two times a day (BID) | ORAL | Status: DC

## 2017-01-12 NOTE — ED Notes (Signed)
Pt ambulated to BR and back to room without difficultly  

## 2017-01-12 NOTE — ED Provider Notes (Signed)
[facility] EMERGENCY DEPARTMENT Provider Note   CSN: [id number] Arrival date & time: 01/12/17  1504     History   Chief Complaint Chief Complaint  Patient presents with  . V70.1    HPI Howard Bishop is a 41 y.o. male.  HPI  Pt was seen at 1555. Per pt, c/o gradual onset and persistence of constant SI that began today. Pt states he has been "self medicating with alcohol for schizophrenia" instead of taking his meds. Pt states he has not taken his psych meds for the past year. Pt was in the ED yesterday requesting etoh detox and was discharged with outpatient resources and rx librium. Pt returns today c/o SI with plan "to ingest pool chemicals or drown himself." LD etoh and marijuana 1230 today. Denies SA, no HI, no hallucinations. Denies CP/SOB, no abd pain, no N/V/D.   Past Medical History:  Diagnosis Date  . ADHD (attention deficit hyperactivity disorder)   . Anxiety   . Bipolar disorder (HCC)   . Chronic kidney disease   . Gallstone   . Headache(784.0)   . Hepatitis C antibody test positive   . Hx of concussion   . Hypertension   . PTSD (post-traumatic stress disorder)   . Schizoaffective disorder (HCC)   . Seizures [facility])     Patient Active Problem List   Diagnosis Date Noted  . Seizures (HCC) 04/01/2015  . Alcohol abuse 01/18/2014  . 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 PTSD (post-traumatic stress disorder)   . Hepatitis C antibody test positive   . WUJWJXBJ(478.2)     Past Surgical History:  Procedure Laterality Date  . CHOLECYSTECTOMY    . RENAL ARTERY STENT         Home Medications    Prior to Admission medications   Medication Sig Start Date End Date Taking? Authorizing Provider  chlordiazePOXIDE (LIBRIUM) 25 MG capsule 50mg  PO TID x 1D, then 25-50mg  PO BID X 1D, then 25-50mg  PO QD X 1D 01/11/17   Lavera Guise, MD  naproxen sodium (ALEVE) 220 MG tablet Take 440 mg by mouth daily as needed (for pain).     [provider]    Family History Family History  Problem Relation Age of Onset  . Drug abuse Mother   . Alcohol abuse Father   . Anxiety disorder Sister   . Drug abuse Sister     Social History Social History  Substance Use Topics  . Smoking status: Current Every Day Smoker    Packs/day: 1.00    Types: Cigarettes    Start date: 03/29/1990  . Smokeless tobacco: Former Neurosurgeon    Types: Chew  . Alcohol use 0.0 oz/week     Comment: daily      Allergies   Asa [aspirin]; Penicillins; Tomato; Ambien [zolpidem]; and Ultram [tramadol]   Review of Systems Review of Systems ROS: Statement: All systems negative except as marked or noted in the HPI; Constitutional: Negative for fever and chills. ; ; Eyes: Negative for eye pain, redness and discharge. ; ; ENMT: Negative for ear pain, hoarseness, nasal congestion, sinus pressure and sore throat. ; ; Cardiovascular: Negative for chest pain, palpitations, diaphoresis, dyspnea and peripheral edema. ; ; Respiratory: Negative for cough, wheezing and stridor. ; ; Gastrointestinal: Negative for nausea, vomiting, diarrhea, abdominal pain, blood in stool, hematemesis, jaundice and rectal bleeding. . ; ; Genitourinary: Negative for dysuria, flank pain and hematuria. ; ; Musculoskeletal: Negative for back pain  and neck pain. Negative for swelling and trauma.; ; Skin: Negative for pruritus, rash, abrasions, blisters, bruising and skin lesion.; ; Neuro: Negative for headache, lightheadedness and neck stiffness. Negative for weakness, altered level of consciousness, altered mental status, extremity weakness, paresthesias, involuntary movement, seizure and syncope.; Psych:  +etoh abuse, +SI. No SA, no HI, no hallucinations.      Physical Exam Updated Vital Signs BP (!) 135/95   Pulse (!) 116   Temp 99 F (37.2 C)   Resp 18   Ht 6\' 3"  (1.905 m)   Wt 85.7 kg (189 lb)   SpO2 96%   BMI 23.62 kg/m   Physical Exam 1600: Physical examination:   Nursing notes reviewed; Vital signs and O2 SAT reviewed;  Constitutional: Well developed, Well nourished, Well hydrated, In no acute distress; Head:  Normocephalic, atraumatic; Eyes: EOMI, PERRL, No scleral icterus; ENMT: Mouth and pharynx normal, Mucous membranes moist; Neck: Supple, Full range of motion; Cardiovascular: Regular rate and rhythm; Respiratory: Breath sounds clear, No wheezes.  Speaking full sentences with ease, Normal respiratory effort/excursion; Chest: No deformity, Movement normal; Abdomen: Nondistended; Extremities: No deformity.; Neuro: AA&Ox3, Major CN grossly intact.  Speech clear. No gross focal motor deficits in extremities. Climbs on and off stretcher easily by himself. Gait steady.; Skin: Color normal, Warm, Dry.; Psych:  Affect flat. Endorses SI.    ED Treatments / Results  Labs (all labs ordered are listed, but only abnormal results are displayed)   EKG  EKG Interpretation None       Radiology   Procedures Procedures (including critical care time)  Medications Ordered in ED Medications  LORazepam (ATIVAN) injection 0-4 mg (not administered)    Or  LORazepam (ATIVAN) tablet 0-4 mg (not administered)  LORazepam (ATIVAN) injection 0-4 mg (not administered)    Or  LORazepam (ATIVAN) tablet 0-4 mg (not administered)  thiamine (VITAMIN B-1) tablet 100 mg (not administered)    Or  thiamine (B-1) injection 100 mg (not administered)     Initial Impression / Assessment and Plan / ED Course  I have reviewed the triage vital signs and the nursing notes.  Pertinent labs & imaging results that were available during my care of the patient were reviewed by me and considered in my medical decision making (see chart for details).  MDM Reviewed: previous chart, nursing note and vitals Reviewed previous: labs Interpretation: labs    Results for orders placed or performed during the hospital encounter of 01/12/17  Comprehensive metabolic panel  Result Value  Ref Range   Sodium 139 135 - 145 mmol/L   Potassium 4.0 3.5 - 5.1 mmol/L   Chloride 107 101 - 111 mmol/L   CO2 22 22 - 32 mmol/L   Glucose, Bld 92 65 - 99 mg/dL   BUN 7 6 - 20 mg/dL   Creatinine, Ser 2.530.80 0.61 - 1.24 mg/dL   Calcium 66.410.0 8.9 - 40.310.3 mg/dL   Total Protein 7.3 6.5 - 8.1 g/dL   Albumin 4.4 3.5 - 5.0 g/dL   AST 29 15 - 41 U/L   ALT 59 17 - 63 U/L   Alkaline Phosphatase 81 38 - 126 U/L   Total Bilirubin 0.5 0.3 - 1.2 mg/dL   GFR calc non Af Amer >60 >60 mL/min   GFR calc Af Amer >60 >60 mL/min   Anion gap 10 5 - 15  Ethanol  Result Value Ref Range   Alcohol, Ethyl (B) 44 (H) <10 mg/dL  CBC with Differential  Result  Value Ref Range   WBC 7.3 4.0 - 10.5 K/uL   RBC 4.68 4.22 - 5.81 MIL/uL   Hemoglobin 15.1 13.0 - 17.0 g/dL   HCT 16.1 09.6 - 04.5 %   MCV 91.2 78.0 - 100.0 fL   MCH 32.3 26.0 - 34.0 pg   MCHC 35.4 30.0 - 36.0 g/dL   RDW 40.9 81.1 - 91.4 %   Platelets 233 150 - 400 K/uL   Neutrophils Relative % 61 %   Neutro Abs 4.5 1.7 - 7.7 K/uL   Lymphocytes Relative 31 %   Lymphs Abs 2.3 0.7 - 4.0 K/uL   Monocytes Relative 6 %   Monocytes Absolute 0.4 0.1 - 1.0 K/uL   Eosinophils Relative 2 %   Eosinophils Absolute 0.2 0.0 - 0.7 K/uL   Basophils Relative 0 %   Basophils Absolute 0.0 0.0 - 0.1 K/uL  Urine rapid drug screen (hosp performed)  Result Value Ref Range   Opiates NONE DETECTED NONE DETECTED   Cocaine NONE DETECTED NONE DETECTED   Benzodiazepines POSITIVE (A) NONE DETECTED   Amphetamines NONE DETECTED NONE DETECTED   Tetrahydrocannabinol POSITIVE (A) NONE DETECTED   Barbiturates NONE DETECTED NONE DETECTED    1655:  TTS evaluation pending. CIWA protocol ordered.   2200:  TTS has evaluated pt: placement pending. Holding orders written.     Final Clinical Impressions(s) / ED Diagnoses   Final diagnoses:  Alcohol abuse  Suicidal ideation    New Prescriptions New Prescriptions   No medications on file     Samuel Jester,  DO 01/12/17 2325

## 2017-01-12 NOTE — ED Triage Notes (Signed)
PT states he was at the ED yesterday for information on help with alcoholism and was discharged. PT states he has SI today and has plan to ingest pool chemicals or drown himself. PT states he drank 40 oz of beer and smoke marijuana at 1230 today.

## 2017-01-12 NOTE — Progress Notes (Signed)
CSW reviewed pt chart. Per Howard FoundShuvon Rankin, NP, pt meets criteria for inpatient hospitalization.  Pt referral packet sent to the following hospitals: [facility], [facility], [facility], [facility], [facility], [facility]  Disposition:  CSW will continue to follow for placement.  Howard EulerJean T. Kaylyn LimSutter, MSW, LCSWA Disposition Clinical Social Work [phone] (cell) [phone] (office)

## 2017-01-12 NOTE — Progress Notes (Signed)
Pt accepted to [facility] Dr.Syed  is the attending provider.  Call report to [phone] [place] [facility] notified.   Pt is Voluntary.  Pt may be transported by [facility]  Pt scheduled to [facility] TBD at/after 10 AM on 01/13/17.  [facility] ED Nurse, Gearldine BienenstockBrandy notified.  Timmothy EulerJean T. Kaylyn LimSutter, MSW, LCSWA Disposition Clinical Social Work [phone] (cell) [phone] (office)

## 2017-01-12 NOTE — BH Assessment (Signed)
Tele Assessment Note   Patient Name: Howard Bishop MRN: [id number] Referring Physician: EDP Location of Patient: [facility] Location of Provider: Behavioral Health TTS Department  Howard Bishop is a 41 y.o. male who presented to [facility] on a voluntary basis with complaint of suicidal ideation with plan, auditory hallucination, substance use, and depressive symptoms.  Pt stated that he has a history of schizophrenia.  He was last seen by hospital staff on 01/11/17 -- on that date, Pt presented to the ED with complaint of alcohol use/withdrawal and hallucination.  He was discharged.  No TTS consult was called.  Pt provided history.  He reported that although he has a standing diagnosis of Schizophrenia, he has been off his medication for almost two years.  He reported that he experiences psychotic symptoms, primarily auditory hallucination encouraging him to harm himself.  Pt endorsed the following symptoms:  Suicidal ideation with plan to drown himself in the family's pool or drink pool-cleaning chemicals; despondency; auditory hallucination (voices commanding him to harm himself); feelings of worthlessness.  Pt also endorsed daily use of alcohol (8-15 beers per day) and marijuana (3 grams per day).  Pt denied homicidal ideation and self-injury.  Pt reported that he stopped taking his medication because he ran out of money.  He is on disability due to PTSD and schizophrenia.  Pt stated that he lives with his wife and children.  During assessment, Pt presented as alert and oriented.  Pt was dressed in scrubs and appeared disheveled.  Pt's demeanor was calm.  Pt had good eye contact and was cooperative.  Pt's mood was depressed.  Affect was mood-congruent.  Pt endorsed suicidal ideation with plan, auditory hallucination (voices urging him to harm himself), despondency, substance use, and other depressive symptoms.  Pt's speech was normal in rate, rhythm, and volume.  Pt's thoughts were within normal  range, and thought content was logical and goal-oriented.  There was no evidence of delusion.  Pt's impulse control, judgment, and insight were fair to poor.  Consulted with Ephriam Knuckles, FNP who recommended inpatient placement.  Diagnosis: Schizophrenia; Alcohol Use Disorder  Past Medical History:  Past Medical History:  Diagnosis Date  . ADHD (attention deficit hyperactivity disorder)   . Anxiety   . Bipolar disorder (HCC)   . Chronic kidney disease   . Gallstone   . Headache(784.0)   . Hepatitis C antibody test positive   . Hx of concussion   . Hypertension   . PTSD (post-traumatic stress disorder)   . Schizoaffective disorder (HCC)   . Seizures (HCC)     Past Surgical History:  Procedure Laterality Date  . CHOLECYSTECTOMY    . RENAL ARTERY STENT      Family History:  Family History  Problem Relation Age of Onset  . Drug abuse Mother   . Alcohol abuse Father   . Anxiety disorder Sister   . Drug abuse Sister     Social History:  reports that he has been smoking Cigarettes.  He started smoking about 26 years ago. He has been smoking about 1.00 pack per day. He has quit using smokeless tobacco. His smokeless tobacco use included Chew. He reports that he drinks about 4.8 - 9.0 oz of alcohol per week . He reports that he uses drugs, including Marijuana, about 7 times per week.  Additional Social History:  Alcohol / Drug Use Pain Medications: See MAR Prescriptions: See MAR Over the Counter: See MAR History of alcohol / drug use?: Yes Substance #  1 Name of Substance 1: Marijuana 1 - Amount (size/oz): 3 grams 1 - Frequency: Daily 1 - Duration: Ongoing 1 - Last Use / Amount: 01/12/17 Substance #2 Name of Substance 2: Alcohol 2 - Amount (size/oz): 8-15 beers 2 - Frequency: Daily 2 - Duration: Ongoing 2 - Last Use / Amount: 01/12/17  CIWA: CIWA-Ar BP: (!) 135/95 Pulse Rate: (!) 116 Nausea and Vomiting: 2 Tactile Disturbances: moderately severe hallucinations Tremor:  three Auditory Disturbances: moderately severe hallucinations Paroxysmal Sweats: barely perceptible sweating, palms moist Visual Disturbances: very mild sensitivity Anxiety: five Headache, Fullness in Head: none present Agitation: normal activity Orientation and Clouding of Sensorium: oriented and can do serial additions CIWA-Ar Total: 20 COWS:    PATIENT STRENGTHS: (choose at least two) Average or above average intelligence Communication skills  Allergies:  Allergies  Allergen Reactions  . Asa [Aspirin] Swelling    Face swells  . Penicillins Swelling    Has patient had a PCN reaction causing immediate rash, facial/tongue/throat swelling, SOB or lightheadedness with hypotension: Yes Has patient had a PCN reaction causing severe rash involving mucus membranes or skin necrosis: Yes Has patient had a PCN reaction that required hospitalization: Yes Has patient had a PCN reaction occurring within the last 10 years: No If all of the above answers are "NO", then may proceed with Cephalosporin use.   . Tomato Swelling    Lips numb and swollen  . Ambien [Zolpidem]   . Ultram [Tramadol]     Had a seizure the last time he took one-     Home Medications:  (Not in a hospital admission)  OB/GYN Status:  No LMP for male patient.  General Assessment Data Location of Assessment: [facility] TTS Assessment: In system Is this a Tele or Face-to-Face Assessment?: Tele Assessment Is this an Initial Assessment or a Re-assessment for this encounter?: Initial Assessment Marital status: Married Is patient pregnant?: No Pregnancy Status: No Living Arrangements: Spouse/significant other, Children Can pt return to current living arrangement?: Yes Admission Status: Voluntary Is patient capable of signing voluntary admission?: Yes Referral Source: Self/Family/Friend Insurance type: BCBS     Crisis Care Plan Living Arrangements: Spouse/significant other, Children Name of Psychiatrist: None  currently Name of Therapist: None currently  Education Status Is patient currently in school?: No  Risk to self with the past 6 months Suicidal Ideation: Yes-Currently Present Has patient been a risk to self within the past 6 months prior to admission? : Yes Suicidal Intent: Yes-Currently Present Has patient had any suicidal intent within the past 6 months prior to admission? : Yes Is patient at risk for suicide?: Yes Suicidal Plan?: Yes-Currently Present Has patient had any suicidal plan within the past 6 months prior to admission? : Yes Specify Current Suicidal Plan: Drink chemicals, drown self Access to Means: Yes What has been your use of drugs/alcohol within the last 12 months?: Alcohol, marijuana Previous Attempts/Gestures: Yes How many times?:  (Several) Other Self Harm Risks: substance use Triggers for Past Attempts: Other (Comment) (Alcohol use) Intentional Self Injurious Behavior: None Family Suicide History: No Recent stressful life event(s): Other (Comment) (Alcohol use) Persecutory voices/beliefs?: Yes Depression: Yes Depression Symptoms: Despondent, Insomnia, Tearfulness, Feeling worthless/self pity, Loss of interest in usual pleasures, Isolating, Fatigue Substance abuse history and/or treatment for substance abuse?: Yes Suicide prevention information given to non-admitted patients: Not applicable  Risk to Others within the past 6 months Homicidal Ideation: No Does patient have any lifetime risk of violence toward others beyond the six months prior  to admission? : No Thoughts of Harm to Others: No Current Homicidal Intent: No Current Homicidal Plan: No Access to Homicidal Means: No History of harm to others?: No Assessment of Violence: None Noted Does patient have access to weapons?: No Criminal Charges Pending?: No Does patient have a court date: No Is patient on probation?: No  Psychosis Hallucinations: Auditory, With command Delusions: None  noted  Mental Status Report Appearance/Hygiene: Disheveled, In scrubs Eye Contact: Fair Motor Activity: Freedom of movement, Unremarkable Speech: Logical/coherent Level of Consciousness: Alert Mood: Depressed Affect: Appropriate to circumstance Anxiety Level: None Thought Processes: Coherent, Relevant Judgement: Partial Orientation: Person, Place, Situation, Time Obsessive Compulsive Thoughts/Behaviors: None  Cognitive Functioning Concentration: Normal Memory: Remote Intact, Recent Intact IQ: Average Insight: Fair Impulse Control: Fair Appetite: Fair Sleep: No Change Vegetative Symptoms: None  ADLScreening [facility]) Patient's cognitive ability adequate to safely complete daily activities?: Yes Patient able to express need for assistance with ADLs?: Yes Independently performs ADLs?: Yes (appropriate for developmental age)  Prior Inpatient Therapy Prior Inpatient Therapy: Yes Prior Therapy Dates: Numerous Prior Therapy Facilty/Provider(s): [facility] Reason for Treatment: Schizophrenia  Prior Outpatient Therapy Prior Outpatient Therapy: Yes Prior Therapy Dates: 2016 Reason for Treatment: Schizophrenia/med management Does patient have an ACCT team?: No Does patient have Intensive In-House Services?  : No Does patient have [facility] services? : No Does patient have P4CC services?: No  ADL Screening (condition at time of admission) Patient's cognitive ability adequate to safely complete daily activities?: Yes Is the patient deaf or have difficulty hearing?: No Does the patient have difficulty seeing, even when wearing glasses/contacts?: No Does the patient have difficulty concentrating, remembering, or making decisions?: No Patient able to express need for assistance with ADLs?: Yes Does the patient have difficulty dressing or bathing?: No Independently performs ADLs?: Yes (appropriate for developmental age) Does the patient have difficulty walking or climbing  stairs?: No Weakness of Legs: None Weakness of Arms/Hands: None  Home Assistive Devices/Equipment Home Assistive Devices/Equipment: None  Therapy Consults (therapy consults require a physician order) PT Evaluation Needed: No OT Evalulation Needed: No SLP Evaluation Needed: No Abuse/Neglect Assessment (Assessment to be complete while patient is alone) Physical Abuse: Denies Verbal Abuse: Denies Sexual Abuse: Denies Exploitation of patient/patient's resources: Denies Self-Neglect: Denies Values / Beliefs Cultural Requests During Hospitalization: None Spiritual Requests During Hospitalization: None Consults Spiritual Care Consult Needed: No Social Work Consult Needed: No Merchant navy officer (For Healthcare) Does Patient Have a Medical Advance Directive?: No    Additional Information 1:1 In Past 12 Months?: No CIRT Risk: No Elopement Risk: No Does patient have medical clearance?: Yes     Disposition:  Disposition Initial Assessment Completed for this Encounter: Yes  This service was provided via telemedicine using a 2-way, interactive audio and video technology.  Names of all persons participating in this telemedicine service and their role in this encounter. Name: Onesimo Lingard Role: Patient             Earline Mayotte 01/12/2017 6:30 PM

## 2017-01-13 NOTE — ED Notes (Addendum)
Per Theo @[facility] in [place], [place], Pt has been accepted and can be transferred as soon as we can do so.

## 2017-01-13 NOTE — ED Notes (Signed)
Pt has been accepted by [facility] in [place].

## 2017-01-13 NOTE — ED Provider Notes (Signed)
Patient has been accepted at [facility] by Dr. Laveda Abbehomas Sneed.   Dione BoozeGlick, [place] Thresher, MD 01/13/17 [phone]

## 2017-01-13 NOTE — ED Notes (Signed)
[facility] called by Pierre BaliS. Goins, Sec

## 2017-07-10 ENCOUNTER — Emergency Department (HOSPITAL_COMMUNITY)
Admission: EM | Admit: 2017-07-10 | Discharge: 2017-07-10 | Disposition: A | Discharge status: Home / Self Care | Payer: BLUE CROSS/BLUE SHIELD | Attending: Emergency Medicine | Admitting: Emergency Medicine

## 2017-07-10 ENCOUNTER — Other Ambulatory Visit: Payer: Self-pay

## 2017-07-10 DIAGNOSIS — Z79899 Other long term (current) drug therapy: Secondary | ICD-10-CM | POA: Insufficient documentation

## 2017-07-10 DIAGNOSIS — Y999 Unspecified external cause status: Secondary | ICD-10-CM | POA: Diagnosis not present

## 2017-07-10 DIAGNOSIS — N189 Chronic kidney disease, unspecified: Secondary | ICD-10-CM | POA: Diagnosis not present

## 2017-07-10 DIAGNOSIS — M2550 Pain in unspecified joint: Secondary | ICD-10-CM | POA: Diagnosis not present

## 2017-07-10 DIAGNOSIS — I129 Hypertensive chronic kidney disease with stage 1 through stage 4 chronic kidney disease, or unspecified chronic kidney disease: Secondary | ICD-10-CM | POA: Insufficient documentation

## 2017-07-10 DIAGNOSIS — Y9389 Activity, other specified: Secondary | ICD-10-CM | POA: Insufficient documentation

## 2017-07-10 DIAGNOSIS — F17210 Nicotine dependence, cigarettes, uncomplicated: Secondary | ICD-10-CM | POA: Insufficient documentation

## 2017-07-10 DIAGNOSIS — Y9289 Other specified places as the place of occurrence of the external cause: Secondary | ICD-10-CM | POA: Diagnosis not present

## 2017-07-10 DIAGNOSIS — W57XXXA Bitten or stung by nonvenomous insect and other nonvenomous arthropods, initial encounter: Secondary | ICD-10-CM | POA: Insufficient documentation

## 2017-07-10 DIAGNOSIS — R51 Headache: Secondary | ICD-10-CM | POA: Diagnosis not present

## 2017-07-10 DIAGNOSIS — S20361A Insect bite (nonvenomous) of right front wall of thorax, initial encounter: Secondary | ICD-10-CM | POA: Insufficient documentation

## 2017-07-10 DIAGNOSIS — R509 Fever, unspecified: Secondary | ICD-10-CM | POA: Diagnosis present

## 2017-07-10 MED ORDER — DOXYCYCLINE HYCLATE 100 MG PO CAPS: 100.0000 mg | ORAL_CAPSULE | Freq: Two times a day (BID) | ORAL | 0 refills | Status: DC

## 2017-07-10 NOTE — ED Provider Notes (Signed)
[facility] EMERGENCY DEPARTMENT Provider Note   CSN: [id number] Arrival date & time: 07/10/17  1405     History   Chief Complaint Chief Complaint  Patient presents with  . Fever    HPI Howard Bishop is a 42 y.o. male.  HPI   Howard Bishop is a 43 y.o. male who presents to the Emergency Department complaining of intermittent fever joint pains and frontal headache for several days.  He states that he removed 2 ticks from his right chest 5 days ago and symptoms have gradually increased.  He has been taking Tylenol and ibuprofen with intermittent relief of the fever.  He noticed today that the areas where the ticks were originally are now red and slightly swollen.  He also reports some itching to the areas. Max fever at home of 101.  He denies vomiting, diarrhea, or nausea.  No neck pain or stiffness.  No other rash.   Past Medical History:  Diagnosis Date  . ADHD (attention deficit hyperactivity disorder)   . Anxiety   . Bipolar disorder (HCC)   . Chronic kidney disease   . Gallstone   . Headache(784.0)   . Hepatitis C antibody test positive   . Hx of concussion   . Hypertension   . PTSD (post-traumatic stress disorder)   . Schizoaffective disorder (HCC)   . Seizures [facility])     Patient Active Problem List   Diagnosis Date Noted  . Seizures (HCC) 04/01/2015  . Alcohol abuse 01/18/2014  . 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 PTSD (post-traumatic stress disorder)   . Hepatitis C antibody test positive   . JXBJYNWG(956.2)     Past Surgical History:  Procedure Laterality Date  . CHOLECYSTECTOMY    . RENAL ARTERY STENT        Home Medications    Prior to Admission medications   Medication Sig Start Date End Date Taking? Authorizing Provider  chlordiazePOXIDE (LIBRIUM) 25 MG capsule 50mg  PO TID x 1D, then 25-50mg  PO BID X 1D, then 25-50mg  PO QD X 1D 01/11/17   Lavera Guise, MD  naproxen sodium (ALEVE) 220 MG tablet Take 440  mg by mouth daily as needed (for pain).    [provider]    Family History Family History  Problem Relation Age of Onset  . Drug abuse Mother   . Alcohol abuse Father   . Anxiety disorder Sister   . Drug abuse Sister     Social History Social History   Tobacco Use  . Smoking status: Current Every Day Smoker    Packs/day: 1.00    Types: Cigarettes    Start date: 03/29/1990  . Smokeless tobacco: Former Neurosurgeon    Types: Chew  Substance Use Topics  . Alcohol use: Yes    Alcohol/week: 4.8 - 9.0 oz    Types: 8 - 15 Glasses of wine per week    Comment: daily   . Drug use: Yes    Frequency: 7.0 times per week    Types: Marijuana    Comment: 3 grams per day     Allergies   Asa [aspirin]; Penicillins; Tomato; Ambien [zolpidem]; and Ultram [tramadol]   Review of Systems Review of Systems  Constitutional: Positive for fever. Negative for chills.  Respiratory: Negative for shortness of breath.   Cardiovascular: Negative for chest pain.  Gastrointestinal: Negative for abdominal pain, diarrhea, nausea and vomiting.  Genitourinary: Negative for difficulty urinating and dysuria.  Musculoskeletal:  Positive for arthralgias (Generalized arthralgias). Negative for joint swelling, neck pain and neck stiffness.  Skin: Positive for wound (tick bites). Negative for color change.  Neurological: Positive for headaches. Negative for dizziness, syncope and weakness.  All other systems reviewed and are negative.    Physical Exam Updated Vital Signs BP (!) 139/91 (BP Location: Right Arm)   Pulse 87   Temp 98.7 F (37.1 C) (Oral)   Resp 16   Ht 6' (1.829 m)   Wt 81.2 kg (179 lb)   SpO2 100%   BMI 24.28 kg/m   Physical Exam  Constitutional: He is oriented to person, place, and time. He appears well-developed and well-nourished. No distress.  HENT:  Head: Normocephalic and atraumatic.  Mouth/Throat: Oropharynx is clear and moist.  Neck: Normal range of motion. Neck supple.    Cardiovascular: Normal rate, regular rhythm and intact distal pulses.  No murmur heard. Pulmonary/Chest: Effort normal and breath sounds normal. No respiratory distress.  Abdominal: Soft. He exhibits no distension. There is no tenderness.  Musculoskeletal: He exhibits no edema or tenderness.  Lymphadenopathy:    He has no cervical adenopathy.  Neurological: He is alert and oriented to person, place, and time. No sensory deficit. He exhibits normal muscle tone. Coordination normal.  Skin: Skin is warm. Capillary refill takes less than 2 seconds. Rash noted. There is erythema.  2 Erythematous papules to the skin of right chest wall with mild surrounding erythema.  No target lesions.  No remaining tick parts seen on exam  Psychiatric: He has a normal mood and affect.  Nursing note and vitals reviewed.    ED Treatments / Results  Labs (all labs ordered are listed, but only abnormal results are displayed) Labs Reviewed - No data to display  EKG None  Radiology No results found.  Procedures Procedures (including critical care time)  Medications Ordered in ED Medications - No data to display   Initial Impression / Assessment and Plan / ED Course  I have reviewed the triage vital signs and the nursing notes.  Pertinent labs & imaging results that were available during my care of the patient were reviewed by me and considered in my medical decision making (see chart for details).     Patient with to take bites 5 days ago, symptomatic. Non-toxic appearing.   Will treat with doxycycline.  Return precautions were discussed.  Patient agrees to treatment plan.  Final Clinical Impressions(s) / ED Diagnoses   Final diagnoses:  Tick bite, initial encounter    ED Discharge Orders    None       Pauline Ausriplett, Harrietta Incorvaia, PA-C 07/10/17 1549    Loren RacerYelverton, David, MD 07/10/17 [phone]

## 2017-07-10 NOTE — ED Triage Notes (Signed)
Patient c/o fever, body aches, and headache. Per patient removed two ticks from chest and abd 5-6 days ago in which large red marks were left. Denies any nausea, vomiting, or diarrhea. Patient taking tylenol and ibuprofen. Patient last took ibuprofen this morning at 9:30am.

## 2017-07-10 NOTE — Discharge Instructions (Addendum)
Continue to take Tylenol and ibuprofen every 4 or 6 hours as needed for fever.  You may apply over-the-counter 1% hydrocortisone cream to the affected areas 3 times a day.  Benadryl if needed for itching.  Take the antibiotic as directed until its finished.  Return to the emergency room for any worsening symptoms such as persistent fever, increasing rash or persistent joint pains.

## 2017-10-16 ENCOUNTER — Encounter (HOSPITAL_COMMUNITY): Payer: Self-pay | Admitting: Emergency Medicine

## 2017-10-16 ENCOUNTER — Other Ambulatory Visit: Payer: Self-pay

## 2017-10-16 ENCOUNTER — Emergency Department (HOSPITAL_COMMUNITY): Payer: BLUE CROSS/BLUE SHIELD

## 2017-10-16 ENCOUNTER — Emergency Department (HOSPITAL_COMMUNITY)
Admission: EM | Admit: 2017-10-16 | Discharge: 2017-10-16 | Disposition: A | Discharge status: Home / Self Care | Payer: BLUE CROSS/BLUE SHIELD | Attending: Emergency Medicine | Admitting: Emergency Medicine

## 2017-10-16 DIAGNOSIS — N189 Chronic kidney disease, unspecified: Secondary | ICD-10-CM | POA: Insufficient documentation

## 2017-10-16 DIAGNOSIS — Y929 Unspecified place or not applicable: Secondary | ICD-10-CM | POA: Diagnosis not present

## 2017-10-16 DIAGNOSIS — Z23 Encounter for immunization: Secondary | ICD-10-CM | POA: Insufficient documentation

## 2017-10-16 DIAGNOSIS — Y999 Unspecified external cause status: Secondary | ICD-10-CM | POA: Diagnosis not present

## 2017-10-16 DIAGNOSIS — I129 Hypertensive chronic kidney disease with stage 1 through stage 4 chronic kidney disease, or unspecified chronic kidney disease: Secondary | ICD-10-CM | POA: Insufficient documentation

## 2017-10-16 DIAGNOSIS — F17210 Nicotine dependence, cigarettes, uncomplicated: Secondary | ICD-10-CM | POA: Diagnosis not present

## 2017-10-16 DIAGNOSIS — S60221A Contusion of right hand, initial encounter: Secondary | ICD-10-CM

## 2017-10-16 DIAGNOSIS — Z79899 Other long term (current) drug therapy: Secondary | ICD-10-CM | POA: Insufficient documentation

## 2017-10-16 DIAGNOSIS — S6991XA Unspecified injury of right wrist, hand and finger(s), initial encounter: Secondary | ICD-10-CM | POA: Diagnosis present

## 2017-10-16 DIAGNOSIS — W228XXA Striking against or struck by other objects, initial encounter: Secondary | ICD-10-CM | POA: Insufficient documentation

## 2017-10-16 DIAGNOSIS — Y9389 Activity, other specified: Secondary | ICD-10-CM | POA: Insufficient documentation

## 2017-10-16 MED ORDER — HYDROCODONE-ACETAMINOPHEN 5-325 MG PO TABS
ORAL_TABLET | ORAL | 0 refills | Status: DC
Start: 2017-10-16 — End: 2021-04-03

## 2017-10-16 MED ORDER — TETANUS-DIPHTH-ACELL PERTUSSIS 5-2.5-18.5 LF-MCG/0.5 IM SUSP
0.5000 mL | Freq: Once | INTRAMUSCULAR | Status: AC
  Administered 2017-10-16: 0.5 mL via INTRAMUSCULAR
  Filled 2017-10-16: qty 0.5

## 2017-10-16 MED ORDER — IBUPROFEN 800 MG PO TABS
800.0000 mg | ORAL_TABLET | Freq: Once | ORAL | Status: AC
  Administered 2017-10-16: 800 mg via ORAL
  Filled 2017-10-16: qty 1

## 2017-10-16 NOTE — ED Triage Notes (Signed)
Patient c/o right hand pain. 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 Patient reports swelling and increased pain today. Denies any fevers. Patient has small cut to right index finger. Last tetanus vaccination between 5-10 years.

## 2017-10-16 NOTE — Discharge Instructions (Addendum)
Try to elevate your hand and apply ice packs on and off when possible.  Keep your hand bandaged as needed for support.  Call Dr. Mort SawyersHarrison's office to arrange a follow-up appointment in 1 week if not improving.  Continue taking ibuprofen 600-800 mg 3 times a day with food.

## 2017-10-16 NOTE — ED Provider Notes (Signed)
[facility] EMERGENCY DEPARTMENT Provider Note   CSN: [id number] Arrival date & time: 10/16/17  1412     History   Chief Complaint Chief Complaint  Patient presents with  . Hand Injury    HPI Howard Bishop is a 42 y.o. male.  HPI   Howard Bishop is a 42 y.o. male who presents to the Emergency Department complaining of right hand pain and swelling.  He states that he was working on a lawnmower yesterday when the wrench slipped causing him to strike his hand on the bottom of the lawnmower.  He complains of immediate pain and swelling to the dorsal surface of the right hand along his metacarpals.  He has several small abrasions present to his fingers and dorsal hand.  Injury occurred yesterday.  Patient is unsure of his last tetanus but believes it was 5 to 6 years ago.  He denies numbness or weakness of his fingers or pain proximal to his wrist.  No other injuries.  He denies use of blood thinners   Past Medical History:  Diagnosis Date  . ADHD (attention deficit hyperactivity disorder)   . Anxiety   . Bipolar disorder (HCC)   . Chronic kidney disease   . Gallstone   . Headache(784.0)   . Hepatitis C antibody test positive   . Hx of concussion   . Hypertension   . PTSD (post-traumatic stress disorder)   . Schizoaffective disorder (HCC)   . Seizures [facility])     Patient Active Problem List   Diagnosis Date Noted  . Seizures (HCC) 04/01/2015  . Alcohol abuse 01/18/2014  . 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 PTSD (post-traumatic stress disorder)   . Hepatitis C antibody test positive   . NWGNFAOZ(308.6)     Past Surgical History:  Procedure Laterality Date  . CHOLECYSTECTOMY    . RENAL ARTERY STENT        Home Medications    Prior to Admission medications   Medication Sig Start Date End Date Taking? Authorizing Provider  chlordiazePOXIDE (LIBRIUM) 25 MG capsule 50mg  PO TID x 1D, then 25-50mg  PO BID X 1D, then 25-50mg  PO QD  X 1D 01/11/17   Lavera Guise, MD  doxycycline (VIBRAMYCIN) 100 MG capsule Take 1 capsule (100 mg total) by mouth 2 (two) times daily. 07/10/17   Jamala Kohen, PA-C  naproxen sodium (ALEVE) 220 MG tablet Take 440 mg by mouth daily as needed (for pain).    [provider]    Family History Family History  Problem Relation Age of Onset  . Drug abuse Mother   . Alcohol abuse Father   . Anxiety disorder Sister   . Drug abuse Sister     Social History Social History   Tobacco Use  . Smoking status: Current Every Day Smoker    Packs/day: 1.00    Types: Cigarettes    Start date: 03/29/1990  . Smokeless tobacco: Former Neurosurgeon    Types: Chew  Substance Use Topics  . Alcohol use: Not Currently    Alcohol/week: 0.0 oz    Frequency: Never    Comment: not since 2018  . Drug use: Yes    Frequency: 7.0 times per week    Types: Marijuana    Comment: 3 grams per day     Allergies   Asa [aspirin]; Penicillins; Tomato; Ambien [zolpidem]; and Ultram [tramadol]   Review of Systems Review of Systems  Constitutional: Negative for chills and fever.  Musculoskeletal: Positive for arthralgias (Right hand pain and swelling) and joint swelling. Negative for back pain and neck pain.  Skin: Negative for color change and wound.       Abrasions right hand and fingers  Neurological: Negative for weakness and numbness.  All other systems reviewed and are negative.    Physical Exam Updated Vital Signs BP 138/81 (BP Location: Left Arm)   Pulse 84   Temp 98.1 F (36.7 C) (Oral)   Resp 18   Ht 6\' 3"  (1.905 m)   Wt 85.7 kg (189 lb)   SpO2 98%   BMI 23.62 kg/m   Physical Exam  Constitutional: He appears well-developed and well-nourished. No distress.  HENT:  Head: Atraumatic.  Cardiovascular: Normal rate, regular rhythm and intact distal pulses.  Pulmonary/Chest: Effort normal and breath sounds normal. No respiratory distress.  Musculoskeletal: He exhibits edema and tenderness.  He exhibits no deformity.       Right hand: He exhibits tenderness and swelling. He exhibits normal capillary refill and no laceration. Normal sensation noted. Normal strength noted. He exhibits no finger abduction and no thumb/finger opposition.       Hands: Soft tissue swelling of the dorsal right hand along the third fourth and fifth metacarpals.  Several small abrasions present to the proximal fingers and hand.  No bony deformities.  Right wrist and forearm are nontender.  Compartments are soft.  Neurological: He is alert. No sensory deficit.  Skin: Skin is warm. Capillary refill takes less than 2 seconds.  Several small abrasions to dorsal hand. No bleeding  Nursing note and vitals reviewed.    ED Treatments / Results  Labs (all labs ordered are listed, but only abnormal results are displayed) Labs Reviewed - No data to display  EKG None  Radiology Dg Hand Complete Right  Result Date: 10/16/2017 CLINICAL DATA:  Hand hit lawnmower EXAM: RIGHT HAND - COMPLETE 3+ VIEW COMPARISON:  Right hand May 12, 2012; report of right fifth finger radiograph May 07, 2015 available. Images from that study not available. FINDINGS: Frontal, oblique and lateral views obtained. There is evidence of old trauma with remodeling in the distal aspect fourth distal phalanx. No acute fracture or dislocation is evident. Joint spaces appear normal. No erosive change. IMPRESSION: Evidence of old healed fracture of the distal aspect of the fifth distal phalanx. No acute fracture or dislocation. No appreciable arthropathy. Electronically Signed   By: Bretta BangWilliam  Woodruff III M.D.   On: 10/16/2017 15:11    Procedures Procedures (including critical care time)  Medications Ordered in ED Medications  ibuprofen (ADVIL,MOTRIN) tablet 800 mg (800 mg Oral Given 10/16/17 1501)     Initial Impression / Assessment and Plan / ED Course  I have reviewed the triage vital signs and the nursing notes.  Pertinent labs &  imaging results that were available during my care of the patient were reviewed by me and considered in my medical decision making (see chart for details).     TD updated.  Neurovascularly intact.  X-ray negative for fracture.  Likely contusion.  abrasions appear minor. Patient agrees to RICE therapy and close orthopedic follow-up in 1 week if not improving.  Final Clinical Impressions(s) / ED Diagnoses   Final diagnoses:  Contusion of right hand, initial encounter    ED Discharge Orders    None       Pauline Ausriplett, Kischa Altice, Cordelia Poche-C 10/16/17 1524    Mesner, Barbara CowerJason, MD 10/16/17 1538

## 2017-11-03 IMAGING — DX DG SHOULDER 2+V*R*
3 series · 3 of 3 positions shown · non-contrast
Comparison: None.

CLINICAL DATA: Right shoulder pain after fall 1 week ago going up
stairs.

EXAM:
RIGHT SHOULDER - 2+ VIEW

[shoulder ap (1 of 3)]
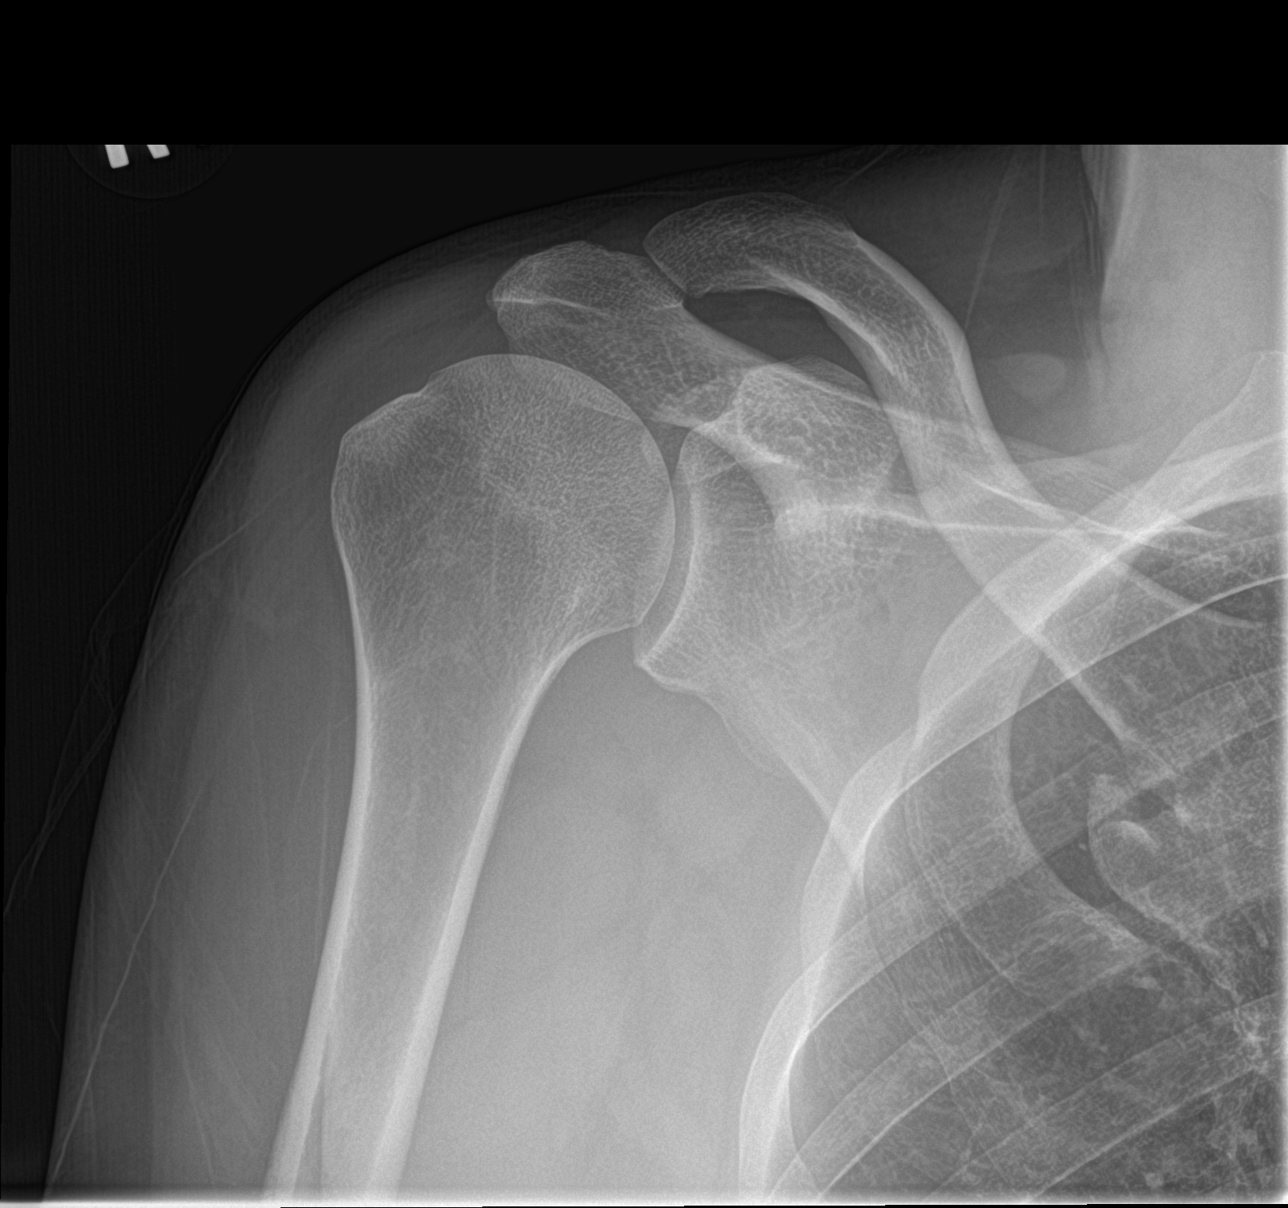

[shoulder ap (2 of 3)]
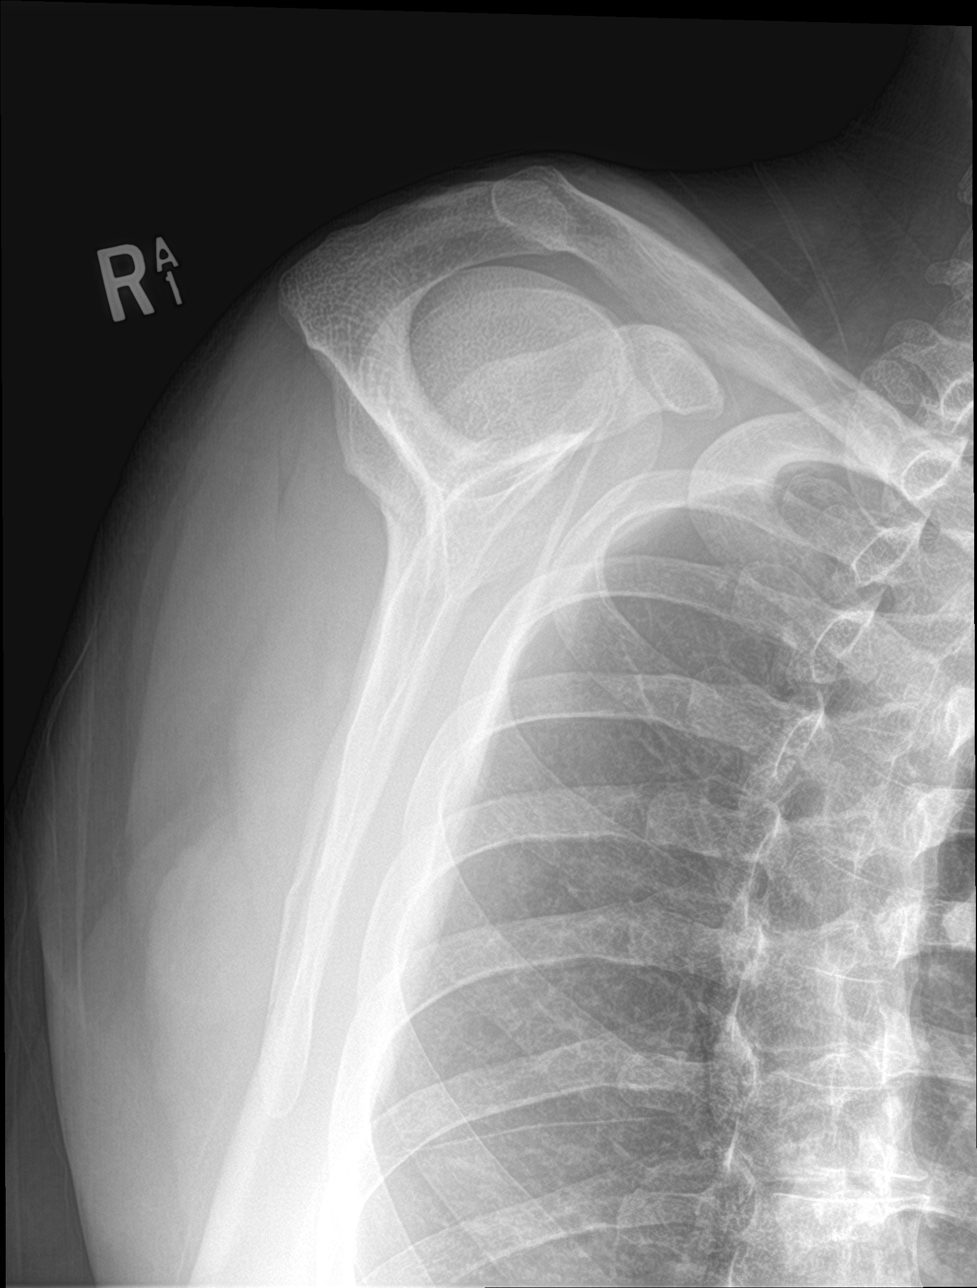

[shoulder ap (3 of 3)]
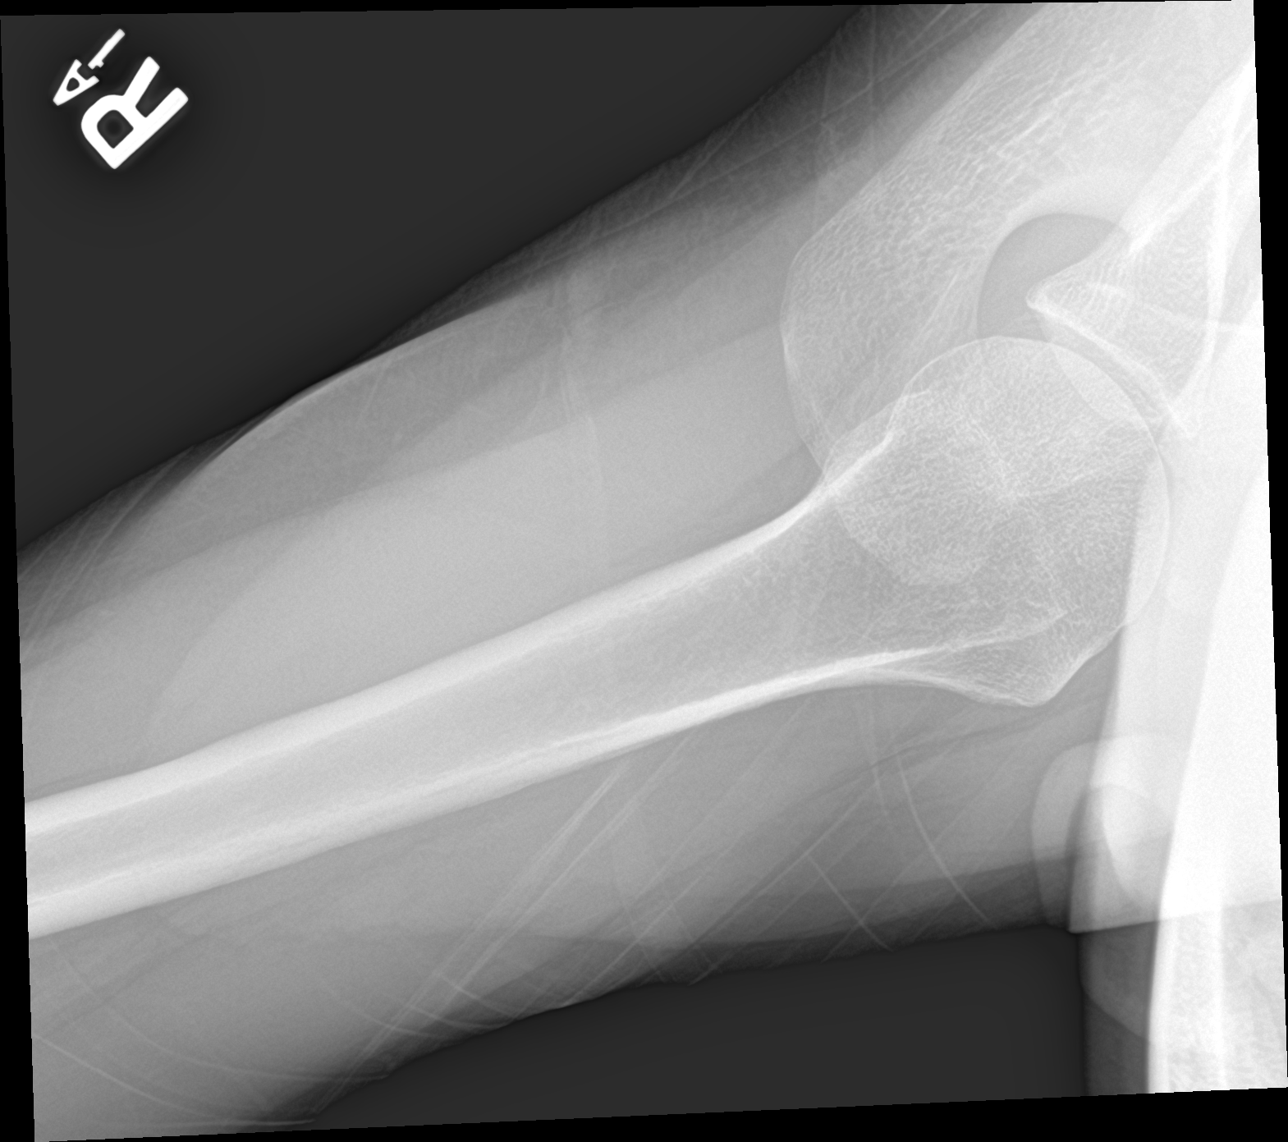

[3 of 3 positions shown; findings below may reference images not displayed]

FINDINGS: There is no evidence of fracture or dislocation. There is no
evidence of arthropathy or other focal bone abnormality. Soft
tissues are unremarkable.
IMPRESSION: Negative.

## 2018-05-11 ENCOUNTER — Encounter (HOSPITAL_COMMUNITY): Payer: Self-pay | Admitting: Emergency Medicine

## 2018-05-11 ENCOUNTER — Other Ambulatory Visit: Payer: Self-pay

## 2018-05-11 ENCOUNTER — Emergency Department (HOSPITAL_COMMUNITY): Payer: BLUE CROSS/BLUE SHIELD

## 2018-05-11 ENCOUNTER — Emergency Department (HOSPITAL_COMMUNITY)
Admission: EM | Admit: 2018-05-11 | Discharge: 2018-05-11 | Disposition: A | Discharge status: Home / Self Care | Payer: BLUE CROSS/BLUE SHIELD | Attending: Emergency Medicine | Admitting: Emergency Medicine

## 2018-05-11 DIAGNOSIS — M79671 Pain in right foot: Secondary | ICD-10-CM | POA: Insufficient documentation

## 2018-05-11 DIAGNOSIS — N189 Chronic kidney disease, unspecified: Secondary | ICD-10-CM | POA: Insufficient documentation

## 2018-05-11 DIAGNOSIS — F17210 Nicotine dependence, cigarettes, uncomplicated: Secondary | ICD-10-CM | POA: Insufficient documentation

## 2018-05-11 DIAGNOSIS — I129 Hypertensive chronic kidney disease with stage 1 through stage 4 chronic kidney disease, or unspecified chronic kidney disease: Secondary | ICD-10-CM | POA: Insufficient documentation

## 2018-05-11 DIAGNOSIS — W109XXA Fall (on) (from) unspecified stairs and steps, initial encounter: Secondary | ICD-10-CM | POA: Diagnosis not present

## 2018-05-11 DIAGNOSIS — Z79899 Other long term (current) drug therapy: Secondary | ICD-10-CM | POA: Diagnosis not present

## 2018-05-11 DIAGNOSIS — R55 Syncope and collapse: Secondary | ICD-10-CM | POA: Diagnosis not present

## 2018-05-11 LAB — BASIC METABOLIC PANEL
Anion gap: 10 (ref 5–15)
BUN: 9 mg/dL (ref 6–20)
CO2: 23 mmol/L (ref 22–32)
Calcium: 9.9 mg/dL (ref 8.9–10.3)
Chloride: 104 mmol/L (ref 98–111)
Creatinine, Ser: 0.72 mg/dL (ref 0.61–1.24)
GFR calc Af Amer: >60 mL/min (ref >60)
GFR calc non Af Amer: >60 mL/min (ref >60)
Glucose, Bld: 96 mg/dL (ref 70–99)
Potassium: 4.3 mmol/L (ref 3.5–5.1)
Sodium: 137 mmol/L (ref 135–145)

## 2018-05-11 LAB — CBC
HCT: 50 % (ref 39.0–52.0)
Hemoglobin: 16.9 g/dL (ref 13.0–17.0)
MCH: 31.1 pg (ref 26.0–34.0)
MCHC: 33.8 g/dL (ref 30.0–36.0)
MCV: 92.1 fL (ref 80.0–100.0)
Platelets: 288 10*3/uL (ref 150–400)
RBC: 5.43 MIL/uL (ref 4.22–5.81)
RDW: 12.3 % (ref 11.5–15.5)
WBC: 8.4 10*3/uL (ref 4.0–10.5)
nRBC: 0 % (ref 0.0–0.2)

## 2018-05-11 LAB — URINALYSIS, ROUTINE W REFLEX MICROSCOPIC
Bilirubin Urine: NEGATIVE
Glucose, UA: NEGATIVE mg/dL
Hgb urine dipstick: NEGATIVE
Ketones, ur: NEGATIVE mg/dL
Leukocytes,Ua: NEGATIVE
Nitrite: NEGATIVE
Protein, ur: NEGATIVE mg/dL
Specific Gravity, Urine: 1.003 — ABNORMAL LOW (ref 1.005–1.030)
pH: 6 (ref 5.0–8.0)

## 2018-05-11 LAB — GLUCOSE, CAPILLARY: Glucose-Capillary: 90 mg/dL (ref 70–99)

## 2018-05-11 LAB — TROPONIN I: Troponin I: <0.03 ng/mL (ref <0.03)

## 2018-05-11 MED ORDER — ACETAMINOPHEN 325 MG PO TABS
ORAL_TABLET | ORAL | Status: AC
  Administered 2018-05-11: 650 mg via ORAL
  Filled 2018-05-11: qty 2

## 2018-05-11 MED ORDER — ACETAMINOPHEN 325 MG PO TABS
650.0000 mg | ORAL_TABLET | Freq: Once | ORAL | Status: AC
  Administered 2018-05-11: 650 mg via ORAL

## 2018-05-11 MED ORDER — SODIUM CHLORIDE 0.9% FLUSH: 3.0000 mL | Freq: Once | INTRAVENOUS | Status: DC

## 2018-05-11 NOTE — ED Notes (Signed)
Patient transported to X-ray 

## 2018-05-11 NOTE — ED Notes (Signed)
Patient given 650 mg's of Tylenol for right leg and neck pain.

## 2018-05-11 NOTE — ED Triage Notes (Signed)
Patient reports going to take his dog out last night and waking up on the ground. C/O injury to his right ankle.

## 2018-05-11 NOTE — ED Provider Notes (Signed)
[facility] EMERGENCY DEPARTMENT Provider Note   CSN: [id number] Arrival date & time: 05/11/18  1823     History   Chief Complaint Chief Complaint  Patient presents with  . Loss of Consciousness    HPI Howard Bishop is a 43 y.o. male.  Patient states he took the dog out to go the bathroom around 2 AM.  Then he came inside he was walking up the stairs and passed out. he is not sure of family history as he fell or what happened.  Patient complains of pain to his right foot and back of his head  The history is provided by the patient. No language interpreter was used.  Loss of Consciousness  Episode history:  Single Most recent episode:  Today Timing:  Rare Progression:  Resolved Chronicity:  New Context: not blood draw   Witnessed: no   Relieved by:  Nothing Worsened by:  Nothing Ineffective treatments:  None tried Associated symptoms: no anxiety, no chest pain, no headaches and no seizures   Risk factors: no congenital heart disease     Past Medical History:  Diagnosis Date  . ADHD (attention deficit hyperactivity disorder)   . Anxiety   . Bipolar disorder (HCC)   . Chronic kidney disease   . Gallstone   . Headache(784.0)   . Hepatitis C antibody test positive   . Hx of concussion   . Hypertension   . PTSD (post-traumatic stress disorder)   . Schizoaffective disorder (HCC)   . Seizures [facility])     Patient Active Problem List   Diagnosis Date Noted  . Seizures (HCC) 04/01/2015  . Alcohol abuse 01/18/2014  . 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 PTSD (post-traumatic stress disorder)   . Hepatitis C antibody test positive   . [id number](478.2)     Past Surgical History:  Procedure Laterality Date  . CHOLECYSTECTOMY    . RENAL ARTERY STENT          Home Medications    Prior to Admission medications   Medication Sig Start Date End Date Taking? Authorizing Provider  chlordiazePOXIDE (LIBRIUM) 25 MG capsule 50mg  PO TID x  1D, then 25-50mg  PO BID X 1D, then 25-50mg  PO QD X 1D 01/11/17   Lavera Guise, MD  doxycycline (VIBRAMYCIN) 100 MG capsule Take 1 capsule (100 mg total) by mouth 2 (two) times daily. 07/10/17   Triplett, Tammy, PA-C  HYDROcodone-acetaminophen (NORCO/VICODIN) 5-325 MG tablet Take one tab po q 4 hrs prn pain 10/16/17   Triplett, Tammy, PA-C  naproxen sodium (ALEVE) 220 MG tablet Take 440 mg by mouth daily as needed (for pain).    [provider]    Family History Family History  Problem Relation Age of Onset  . Drug abuse Mother   . Alcohol abuse Father   . Anxiety disorder Sister   . Drug abuse Sister     Social History Social History   Tobacco Use  . Smoking status: Current Every Day Smoker    Packs/day: 0.50    Types: Cigarettes    Start date: 03/29/1990  . Smokeless tobacco: Former Neurosurgeon    Types: Chew  Substance Use Topics  . Alcohol use: Not Currently    Alcohol/week: 0.0 standard drinks    Frequency: Never    Comment: not since 2018  . Drug use: Yes    Frequency: 7.0 times per week    Types: Marijuana    Comment: 3 grams per day  Allergies   Asa [aspirin]; Penicillins; Tomato; Ambien [zolpidem]; and Ultram [tramadol]   Review of Systems Review of Systems  Constitutional: Negative for appetite change and fatigue.  HENT: Negative for congestion, ear discharge and sinus pressure.   Eyes: Negative for discharge.  Respiratory: Negative for cough.   Cardiovascular: Positive for syncope. Negative for chest pain.  Gastrointestinal: Negative for abdominal pain and diarrhea.  Genitourinary: Negative for frequency and hematuria.  Musculoskeletal: Negative for back pain.  Skin: Negative for rash.  Neurological: Positive for syncope. Negative for seizures and headaches.  Psychiatric/Behavioral: Negative for hallucinations.     Physical Exam Updated Vital Signs BP 137/89   Pulse 72   Temp 98.6 F (37 C) (Oral)   Resp 18   Ht 6\' 3"  (1.905 m)   Wt 85.7 kg    SpO2 100%   BMI 23.62 kg/m   Physical Exam Vitals signs and nursing note reviewed.  Constitutional:      Appearance: He is well-developed.  HENT:     Head: Normocephalic.     Nose: Nose normal.  Eyes:     General: No scleral icterus.    Conjunctiva/sclera: Conjunctivae normal.  Neck:     Musculoskeletal: Neck supple.     Thyroid: No thyromegaly.  Cardiovascular:     Rate and Rhythm: Normal rate and regular rhythm.     Heart sounds: No murmur. No friction rub. No gallop.   Pulmonary:     Breath sounds: No stridor. No wheezing or rales.  Chest:     Chest wall: No tenderness.  Abdominal:     General: There is no distension.     Tenderness: There is no abdominal tenderness. There is no rebound.  Musculoskeletal: Normal range of motion.     Comments: Tender top right foot  Lymphadenopathy:     Cervical: No cervical adenopathy.  Skin:    Findings: No erythema or rash.  Neurological:     Mental Status: He is oriented to person, place, and time.     Motor: No abnormal muscle tone.     Coordination: Coordination normal.  Psychiatric:        Behavior: Behavior normal.      ED Treatments / Results  Labs (all labs ordered are listed, but only abnormal results are displayed) Labs Reviewed  URINALYSIS, ROUTINE W REFLEX MICROSCOPIC - Abnormal; Notable for the following components:      Result Value   Color, Urine STRAW (*)    Specific Gravity, Urine 1.003 (*)    All other components within normal limits  BASIC METABOLIC PANEL  CBC  GLUCOSE, CAPILLARY  TROPONIN I  CBG MONITORING, ED    EKG None  Radiology Dg Ankle Complete Right  Result Date: 05/11/2018 CLINICAL DATA:  The patient states he fell down some steps at 0200HRS today, he does not remember the fall. He has since had worsening mid anterior to lateral right ankle pain. EXAM: RIGHT ANKLE - COMPLETE 3+ VIEW COMPARISON:  None. FINDINGS: There is no evidence of fracture, dislocation, or joint effusion. There is  no evidence of arthropathy or other focal bone abnormality. Soft tissues are unremarkable. IMPRESSION: Negative. Electronically Signed   By: Norva PavlovElizabeth  Brown M.D.   On: 05/11/2018 19:56   Ct Head Wo Contrast  Result Date: 05/11/2018 CLINICAL DATA:  Recent fall EXAM: CT HEAD WITHOUT CONTRAST CT CERVICAL SPINE WITHOUT CONTRAST TECHNIQUE: Multidetector CT imaging of the head and cervical spine was performed following the standard protocol without intravenous contrast.  Multiplanar CT image reconstructions of the cervical spine were also generated. COMPARISON:  04/30/2012 FINDINGS: CT HEAD FINDINGS Brain: No evidence of acute infarction, hemorrhage, hydrocephalus, extra-axial collection or mass lesion/mass effect. Vascular: No hyperdense vessel or unexpected calcification. Skull: Normal. Negative for fracture or focal lesion. Sinuses/Orbits: No acute finding. Other: None. CT CERVICAL SPINE FINDINGS Alignment: Within normal limits. Skull base and vertebrae: 7 cervical segments are well visualized. Vertebral body height is well maintained. Mild osteophytic changes are noted from C4-C7. No anterolisthesis is noted. No acute fracture or acute facet abnormality is noted. Soft tissues and spinal canal: Surrounding soft tissues are within normal limits. Upper chest: Mild emphysematous changes are noted. Other: None IMPRESSION: CT of the head: No acute intracranial abnormality noted. CT of cervical spine: Mild degenerative changes without acute abnormality. Electronically Signed   By: Alcide Clever M.D.   On: 05/11/2018 21:32   Ct Cervical Spine Wo Contrast  Result Date: 05/11/2018 CLINICAL DATA:  Recent fall EXAM: CT HEAD WITHOUT CONTRAST CT CERVICAL SPINE WITHOUT CONTRAST TECHNIQUE: Multidetector CT imaging of the head and cervical spine was performed following the standard protocol without intravenous contrast. Multiplanar CT image reconstructions of the cervical spine were also generated. COMPARISON:  04/30/2012  FINDINGS: CT HEAD FINDINGS Brain: No evidence of acute infarction, hemorrhage, hydrocephalus, extra-axial collection or mass lesion/mass effect. Vascular: No hyperdense vessel or unexpected calcification. Skull: Normal. Negative for fracture or focal lesion. Sinuses/Orbits: No acute finding. Other: None. CT CERVICAL SPINE FINDINGS Alignment: Within normal limits. Skull base and vertebrae: 7 cervical segments are well visualized. Vertebral body height is well maintained. Mild osteophytic changes are noted from C4-C7. No anterolisthesis is noted. No acute fracture or acute facet abnormality is noted. Soft tissues and spinal canal: Surrounding soft tissues are within normal limits. Upper chest: Mild emphysematous changes are noted. Other: None IMPRESSION: CT of the head: No acute intracranial abnormality noted. CT of cervical spine: Mild degenerative changes without acute abnormality. Electronically Signed   By: Alcide Clever M.D.   On: 05/11/2018 21:32   Dg Foot Complete Right  Result Date: 05/11/2018 CLINICAL DATA:  Fall down steps.  Pain EXAM: RIGHT FOOT COMPLETE - 3+ VIEW COMPARISON:  05/11/2018 FINDINGS: Small bone fragment off the proximal anterior navicular which could reflect a small avulsed fragment. This is age indeterminate. No additional bony abnormality. Soft tissues intact. Joint spaces maintained. IMPRESSION: Small bone fragment off the anterior proximal navicular near the talonavicular joint. This could reflect a small avulsed fragment, age indeterminate. Electronically Signed   By: Charlett Nose M.D.   On: 05/11/2018 20:54    Procedures Procedures (including critical care time)  Medications Ordered in ED Medications  sodium chloride flush (NS) 0.9 % injection 3 mL (3 mLs Intravenous Not Given 05/11/18 2108)  acetaminophen (TYLENOL) tablet 650 mg (650 mg Oral Given 05/11/18 2119)     Initial Impression / Assessment and Plan / ED Course  I have reviewed the triage vital signs and the  nursing notes.  Pertinent labs & imaging results that were available during my care of the patient were reviewed by me and considered in my medical decision making (see chart for details).     Labs unremarkable.  X-rays negative except for possible fractured foot.  EKG shows possible biatrial enlargement.  Patient with syncope and right foot injury.  He will follow-up with cardiology for further evaluation of syncope and see his family doctor for his foot.  Final Clinical Impressions(s) / ED Diagnoses  Final diagnoses:  Syncope and collapse    ED Discharge Orders    None       Bethann BerkshireZammit, Rekisha Welling, MD 05/11/18 2159

## 2018-05-11 NOTE — ED Notes (Signed)
Patient back from x-ray 

## 2018-05-11 NOTE — Discharge Instructions (Signed)
Take Tylenol or Motrin for pain in your foot.  Follow-up with your family doctor as needed for your foot.  Call make an appointment with cardiology for further evaluation of your syncope

## 2018-12-19 IMAGING — DX DG HAND COMPLETE 3+V*R*
3 series · 3 of 3 positions shown · non-contrast
Comparison: Right hand May 12, 2012;

CLINICAL DATA: Hand hit lawnmower

EXAM:
RIGHT HAND - COMPLETE 3+ VIEW

[hand pa]
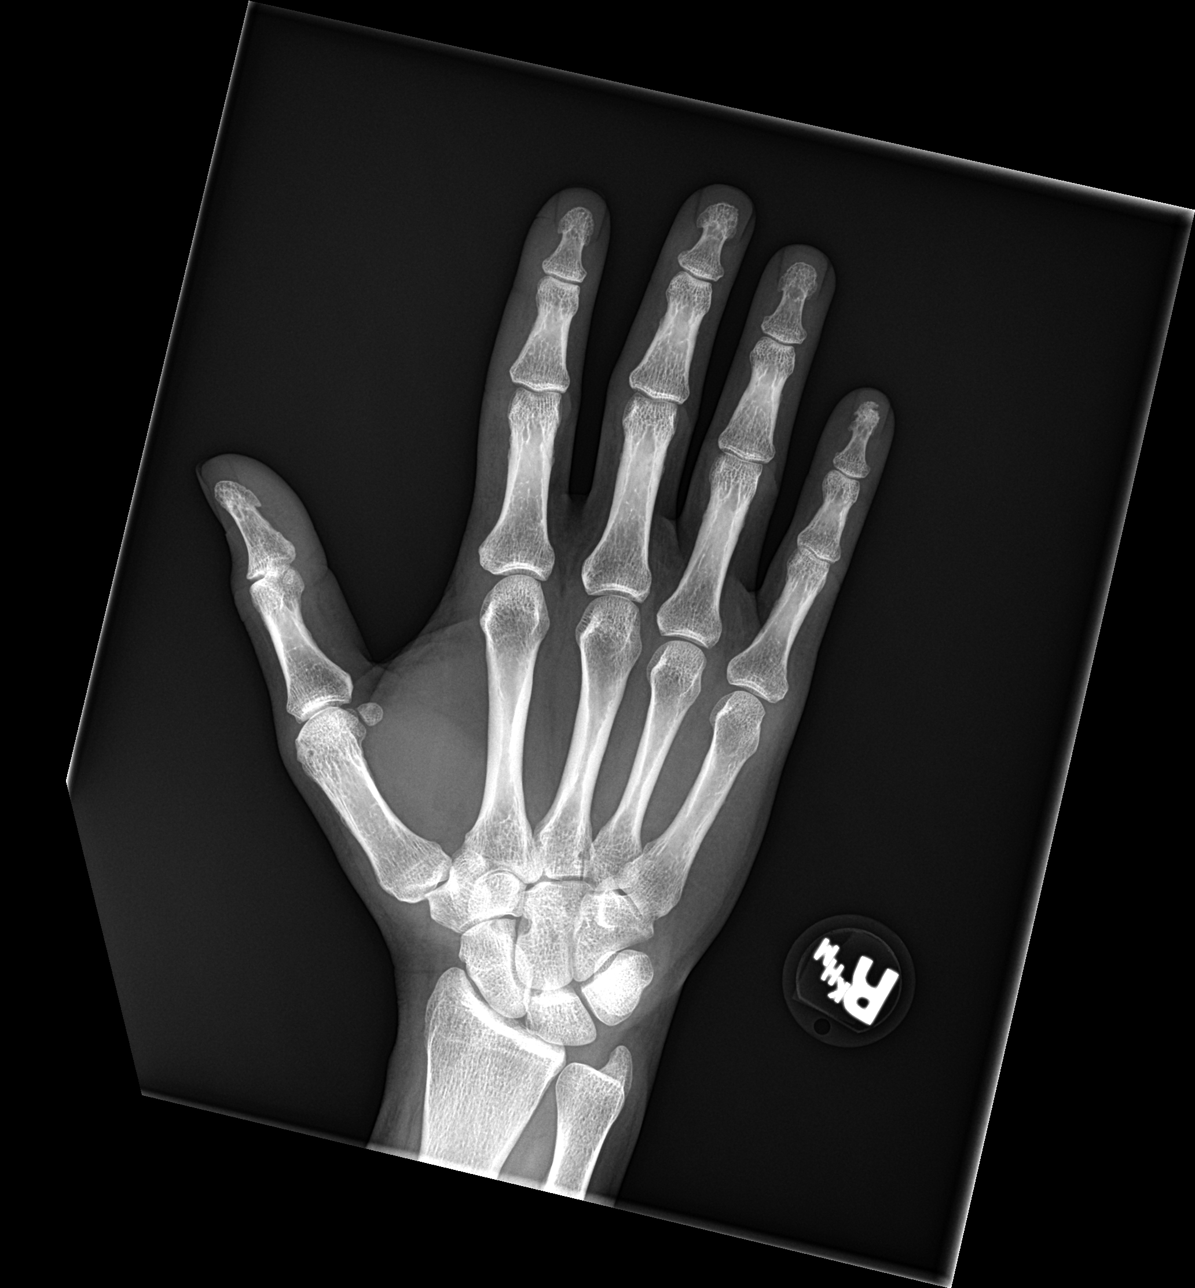

[hand obl]
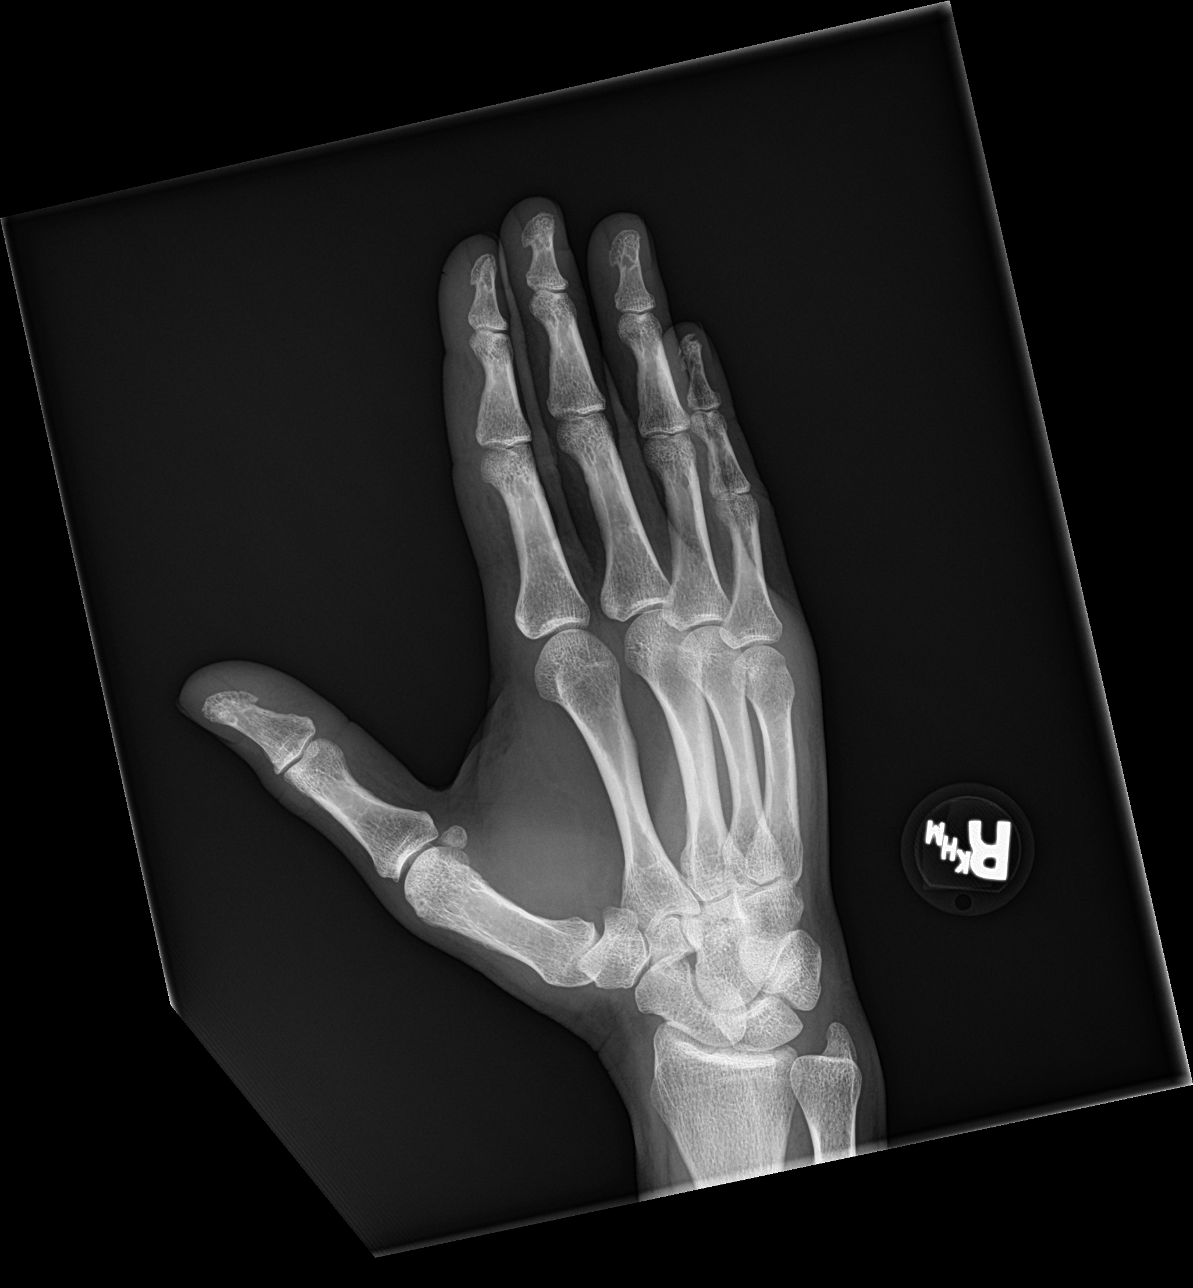

[hand lat]
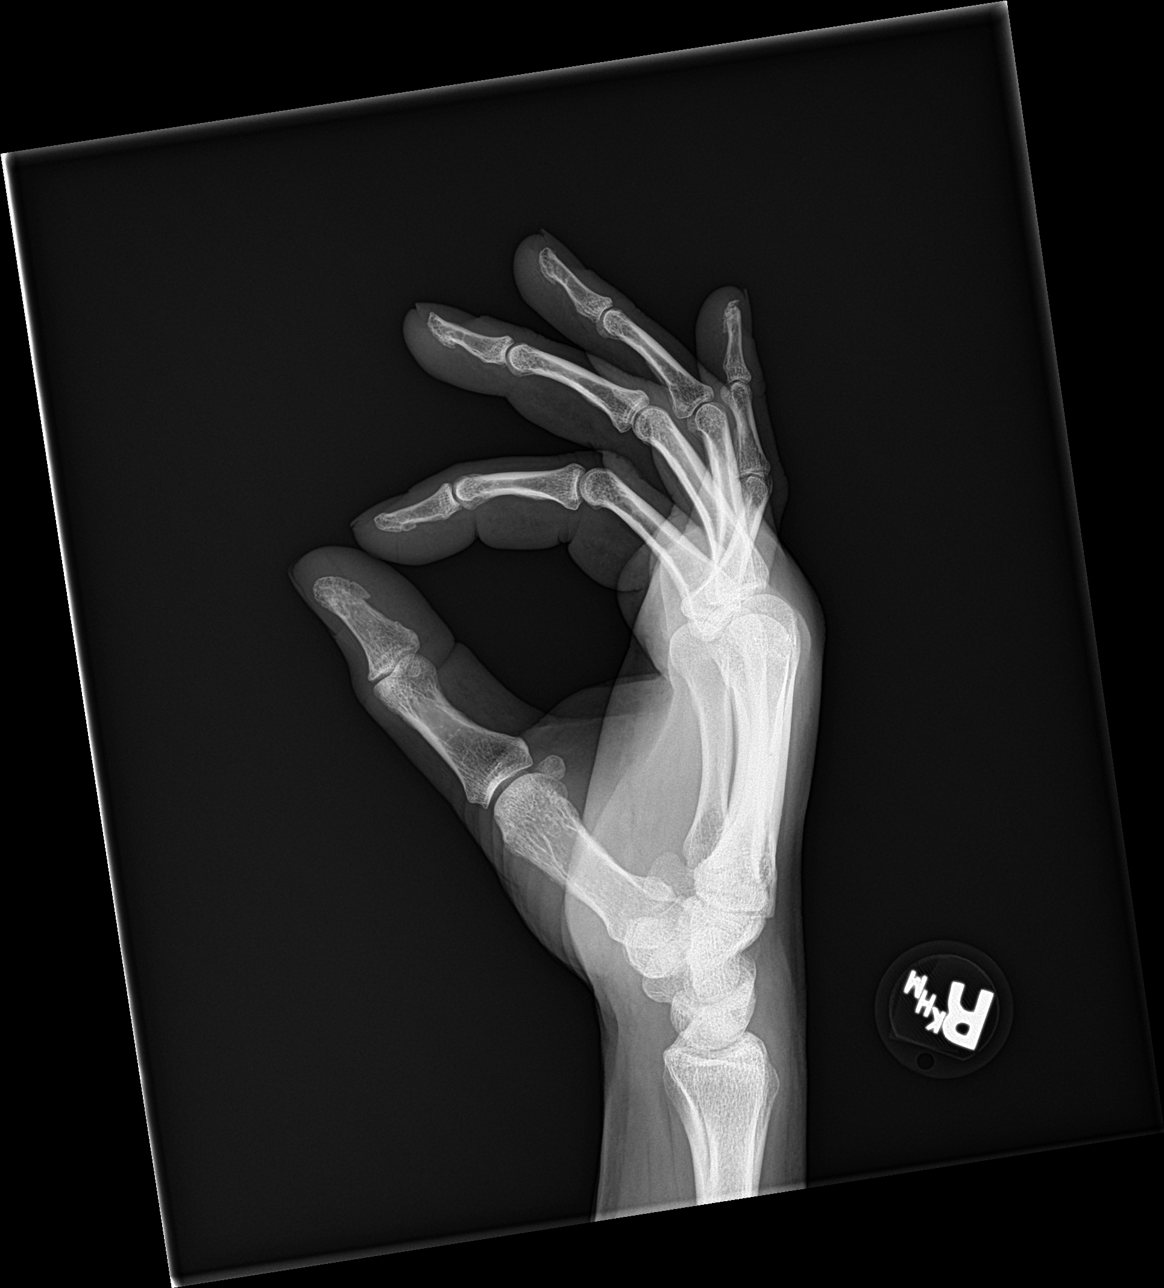

[3 of 3 positions shown; findings below may reference images not displayed]

report of right fifth
finger radiograph May 07, 2015 available. Images from that study
not available.
FINDINGS: Frontal, oblique and lateral views obtained. There is evidence of
old trauma with remodeling in the distal aspect fourth distal
phalanx. No acute fracture or dislocation is evident. Joint spaces
appear normal. No erosive change.
IMPRESSION: Evidence of old healed fracture of the distal aspect of the fifth
distal phalanx. No acute fracture or dislocation. No appreciable
arthropathy.

## 2019-07-14 IMAGING — DX DG ANKLE COMPLETE 3+V*R*
3 series · 4 of 4 positions shown · non-contrast
Comparison: None.

CLINICAL DATA: The patient states he fell down some steps at
[id number] today, he does not remember the fall. He has since had
worsening mid anterior to lateral right ankle pain.

EXAM:
RIGHT ANKLE - COMPLETE 3+ VIEW

[ankle ap]
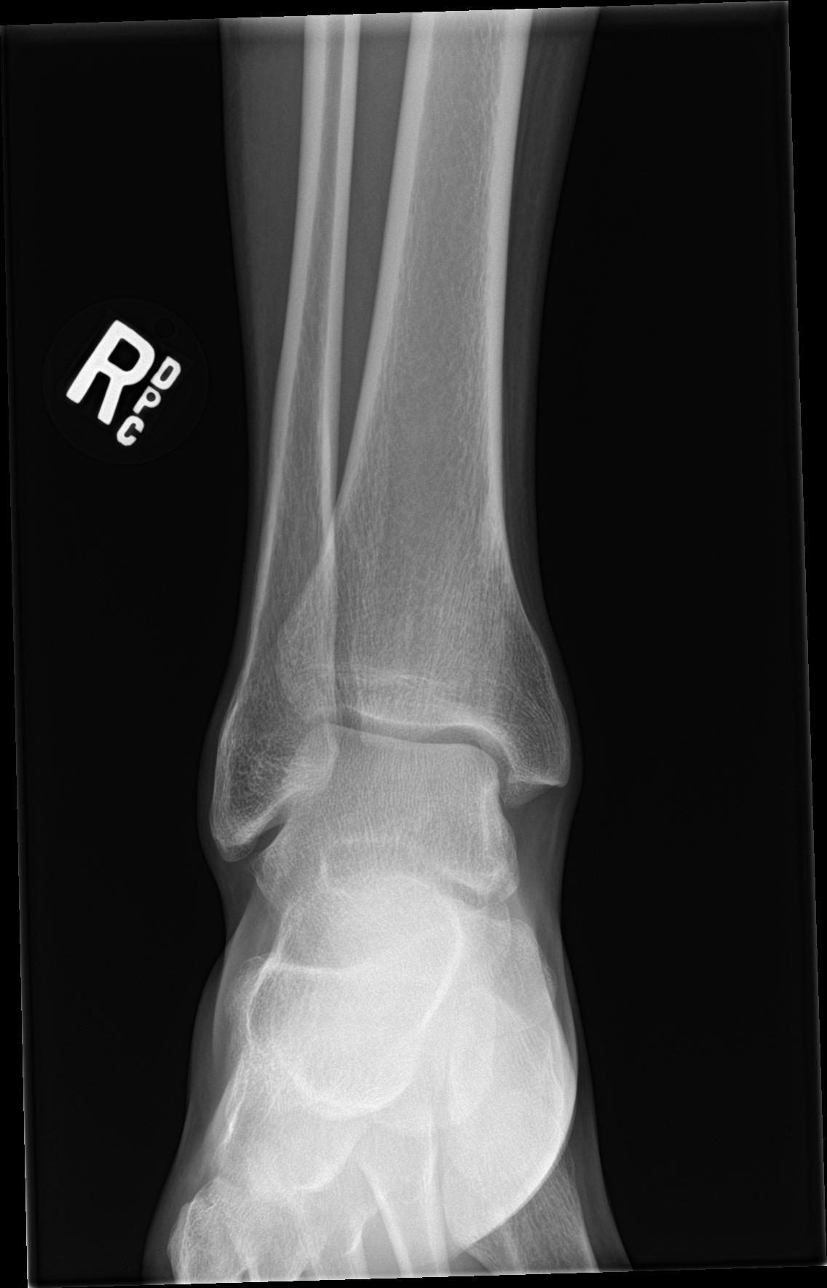

[Series 2: ankle obl · 0.14mm/px · 2 of 2 slices shown]
[im 1/2]
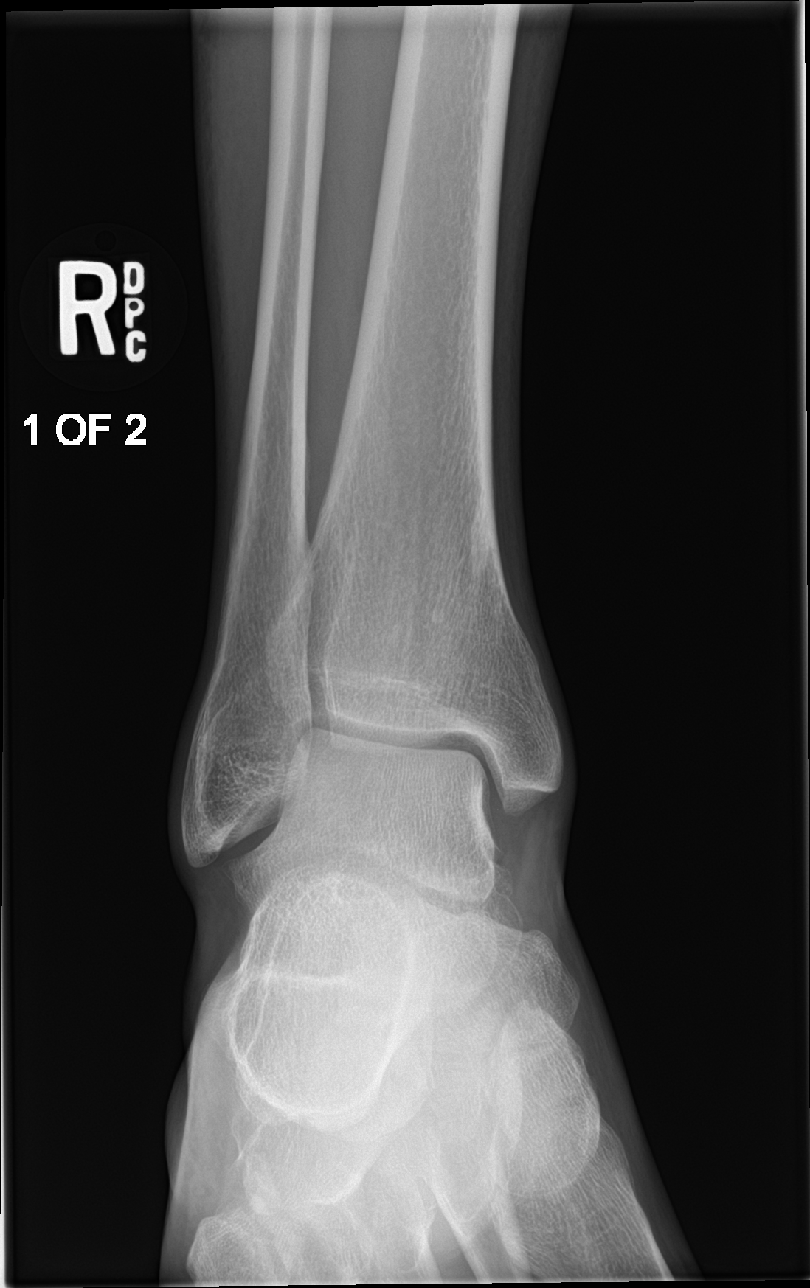
[im 2/2]
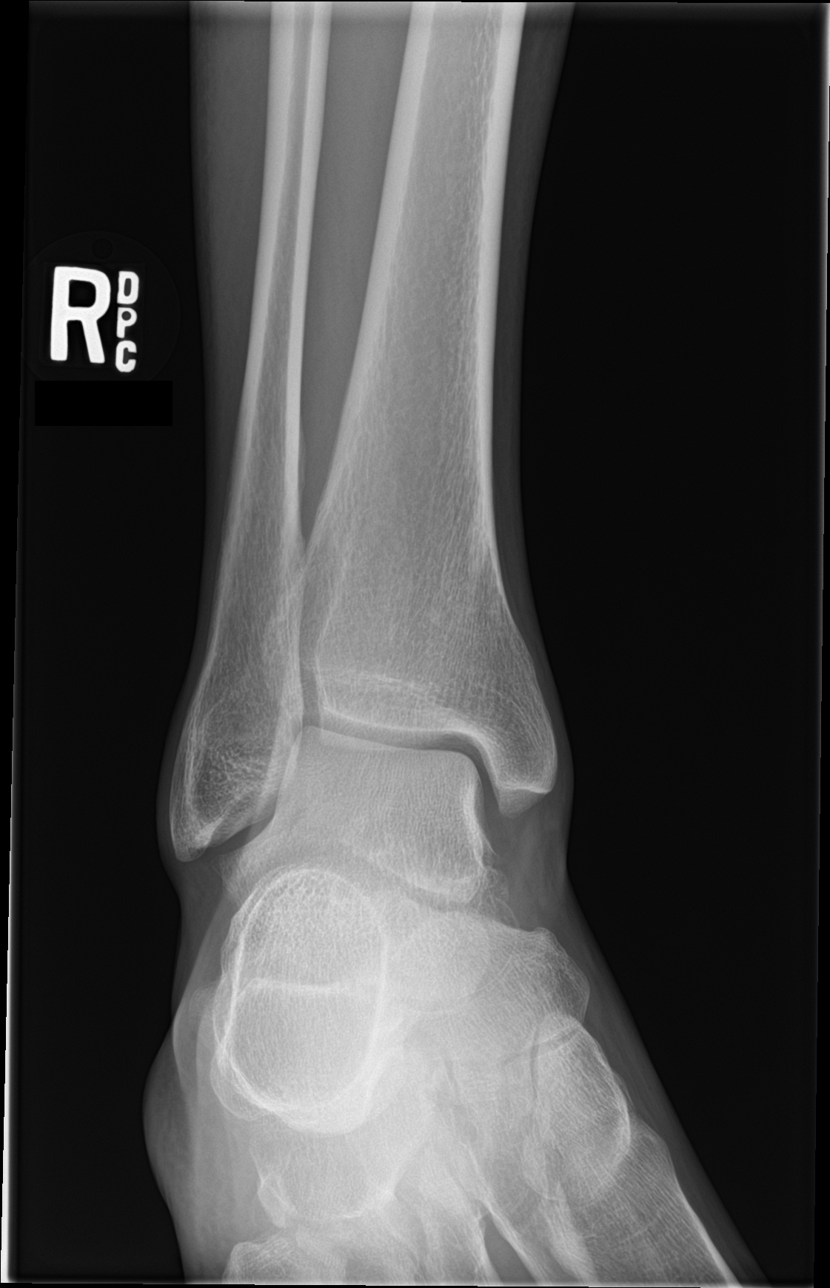

[ankle lat]
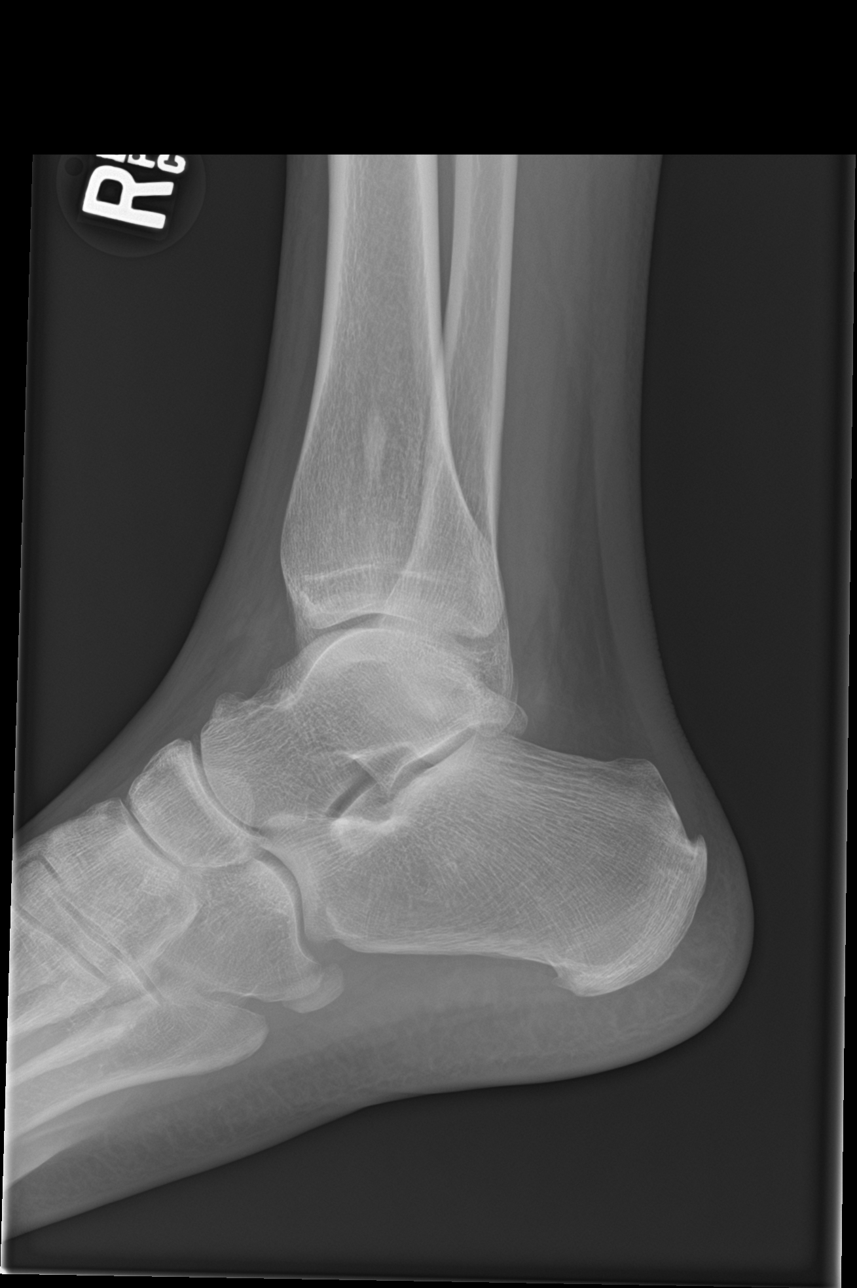

[4 of 4 positions shown; findings below may reference images not displayed]

FINDINGS: There is no evidence of fracture, dislocation, or joint effusion.
There is no evidence of arthropathy or other focal bone abnormality.
Soft tissues are unremarkable.
IMPRESSION: Negative.

## 2019-07-14 IMAGING — DX DG FOOT COMPLETE 3+V*R*
3 series · 3 of 3 positions shown · non-contrast
Comparison: 05/11/2018

CLINICAL DATA: Fall down steps.  Pain

EXAM:
RIGHT FOOT COMPLETE - 3+ VIEW

[foot ap]
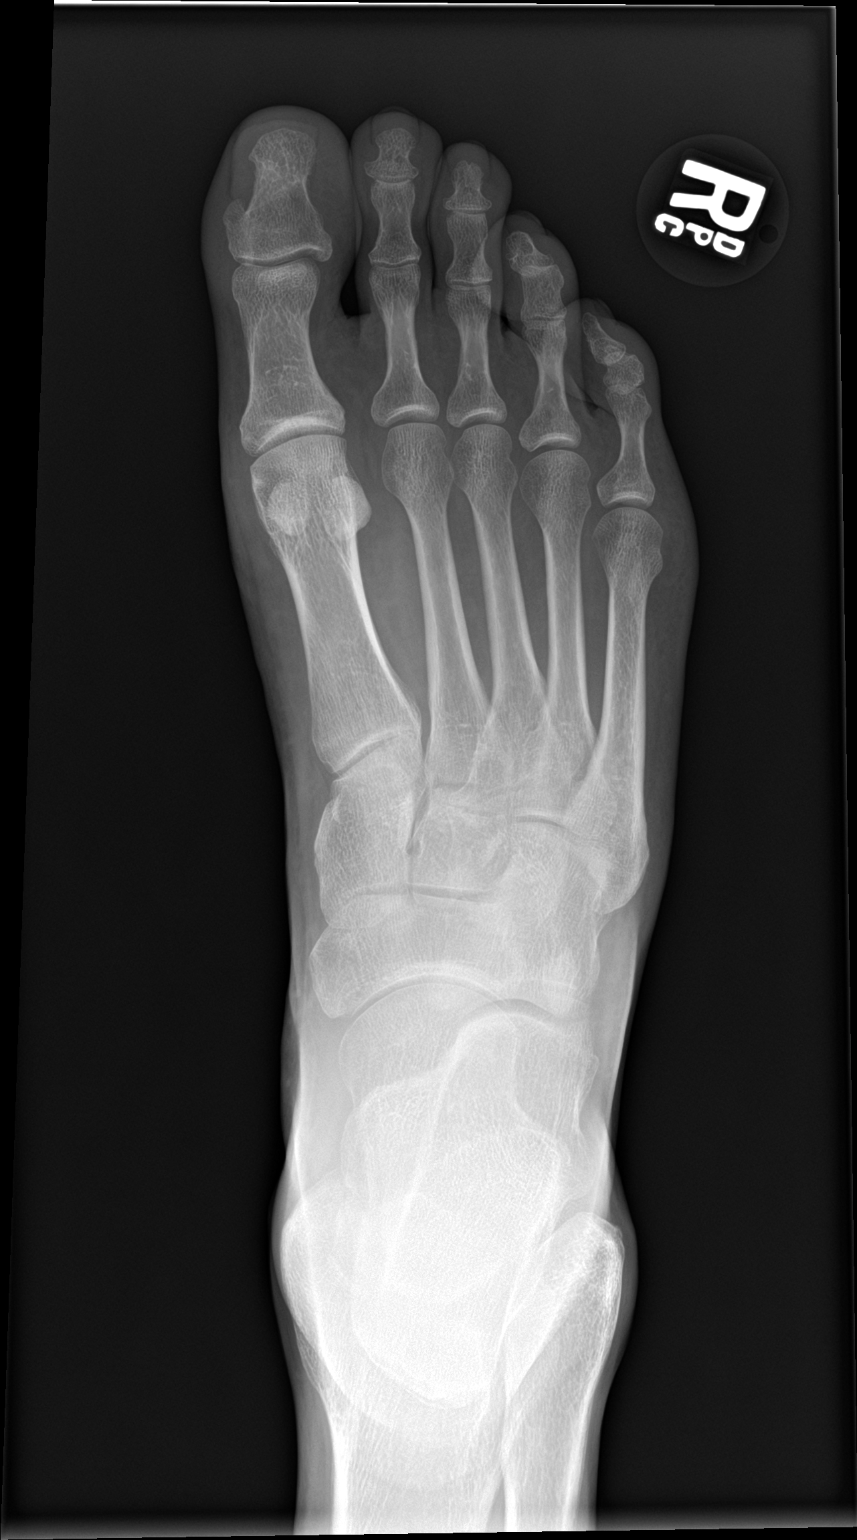

[foot obl]
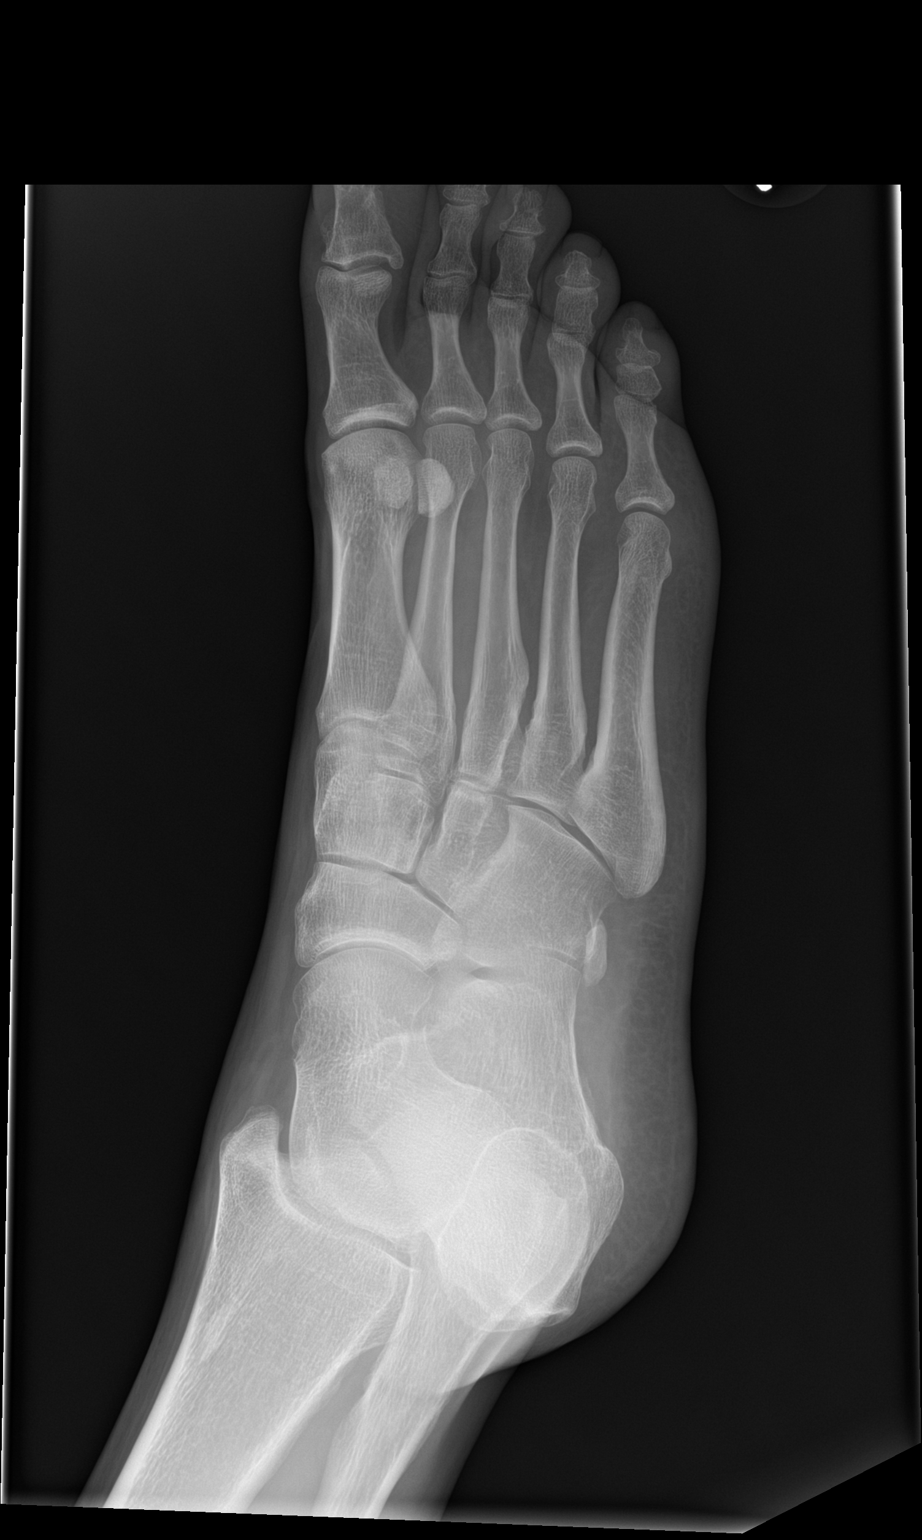

[foot lat]
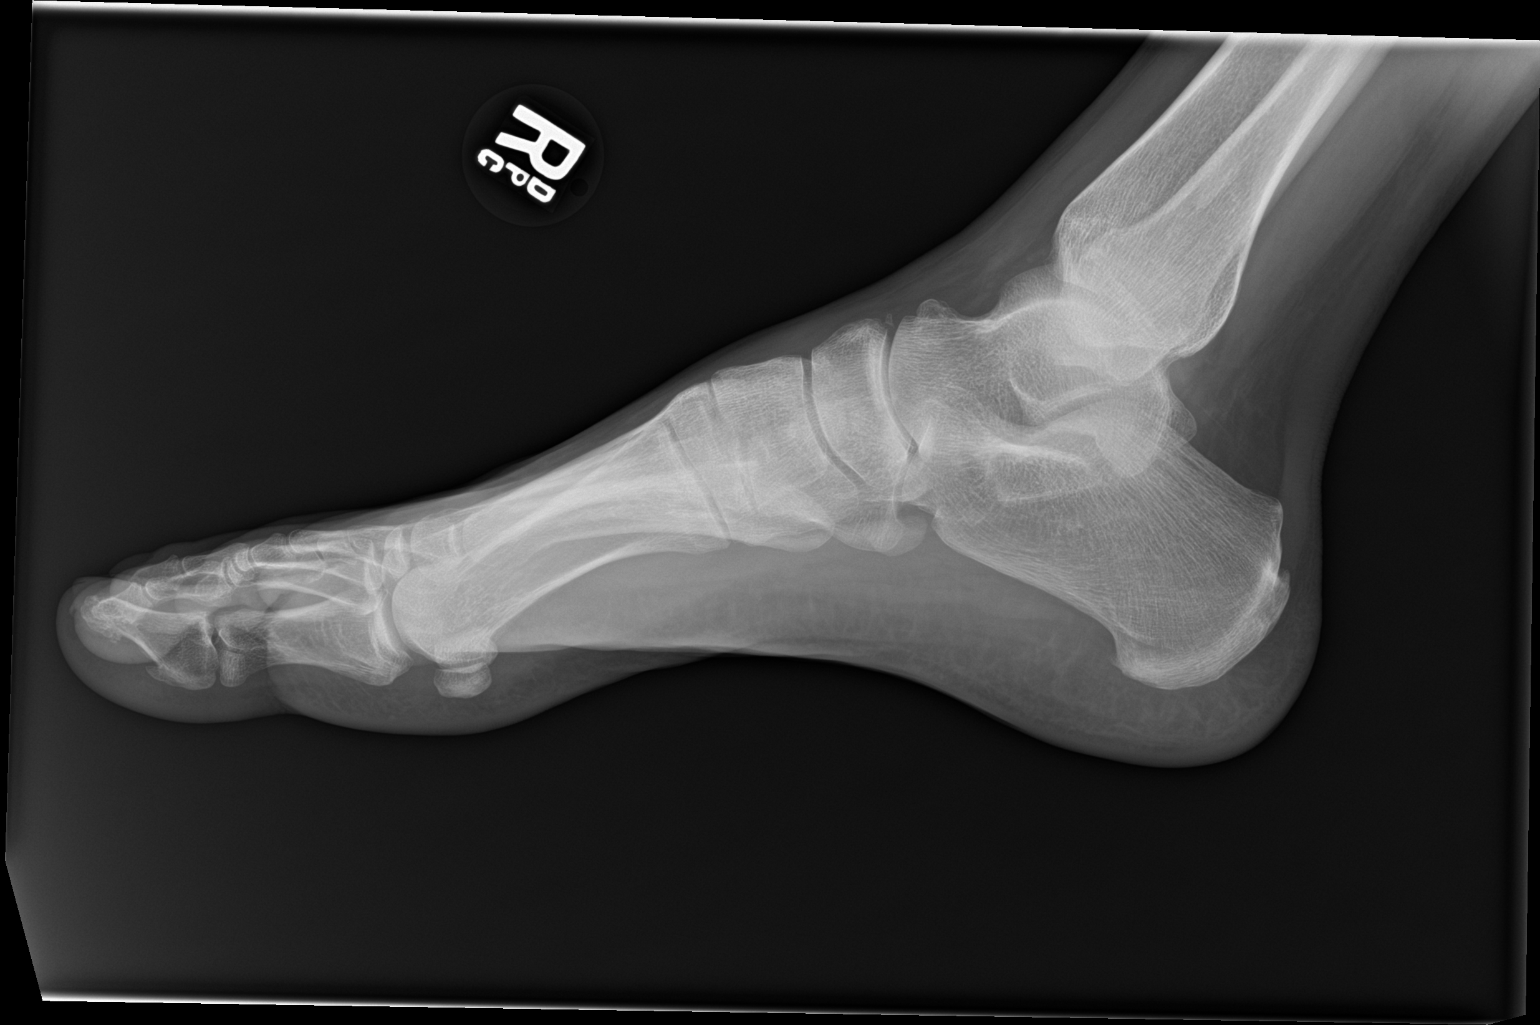

[3 of 3 positions shown; findings below may reference images not displayed]

FINDINGS: Small bone fragment off the proximal anterior navicular which could
reflect a small avulsed fragment. This is age indeterminate. No
additional bony abnormality. Soft tissues intact. Joint spaces
maintained.
IMPRESSION: Small bone fragment off the anterior proximal navicular near the
talonavicular joint. This could reflect a small avulsed fragment,
age indeterminate.

## 2020-03-06 ENCOUNTER — Emergency Department (HOSPITAL_COMMUNITY)
Admission: EM | Admit: 2020-03-06 | Discharge: 2020-03-06 | Disposition: A | Discharge status: Home / Self Care | Payer: BC Managed Care – PPO | Attending: Emergency Medicine | Admitting: Emergency Medicine

## 2020-03-06 ENCOUNTER — Encounter (HOSPITAL_COMMUNITY): Payer: Self-pay | Admitting: Emergency Medicine

## 2020-03-06 ENCOUNTER — Other Ambulatory Visit: Payer: Self-pay

## 2020-03-06 DIAGNOSIS — N189 Chronic kidney disease, unspecified: Secondary | ICD-10-CM | POA: Diagnosis not present

## 2020-03-06 DIAGNOSIS — F17210 Nicotine dependence, cigarettes, uncomplicated: Secondary | ICD-10-CM | POA: Diagnosis not present

## 2020-03-06 DIAGNOSIS — I10 Essential (primary) hypertension: Secondary | ICD-10-CM | POA: Diagnosis not present

## 2020-03-06 DIAGNOSIS — I129 Hypertensive chronic kidney disease with stage 1 through stage 4 chronic kidney disease, or unspecified chronic kidney disease: Secondary | ICD-10-CM | POA: Insufficient documentation

## 2020-03-06 DIAGNOSIS — M5412 Radiculopathy, cervical region: Secondary | ICD-10-CM | POA: Insufficient documentation

## 2020-03-06 DIAGNOSIS — M79602 Pain in left arm: Secondary | ICD-10-CM | POA: Diagnosis not present

## 2020-03-06 DIAGNOSIS — M542 Cervicalgia: Secondary | ICD-10-CM | POA: Diagnosis not present

## 2020-03-06 LAB — CBC
HCT: 45.9 % (ref 39.0–52.0)
Hemoglobin: 15.8 g/dL (ref 13.0–17.0)
MCH: 31.7 pg (ref 26.0–34.0)
MCHC: 34.4 g/dL (ref 30.0–36.0)
MCV: 92.2 fL (ref 80.0–100.0)
Platelets: 296 10*3/uL (ref 150–400)
RBC: 4.98 MIL/uL (ref 4.22–5.81)
RDW: 12.7 % (ref 11.5–15.5)
WBC: 6.8 10*3/uL (ref 4.0–10.5)
nRBC: 0 % (ref 0.0–0.2)

## 2020-03-06 LAB — BASIC METABOLIC PANEL
Anion gap: 9 (ref 5–15)
BUN: 11 mg/dL (ref 6–20)
CO2: 24 mmol/L (ref 22–32)
Calcium: 9.7 mg/dL (ref 8.9–10.3)
Chloride: 105 mmol/L (ref 98–111)
Creatinine, Ser: 0.75 mg/dL (ref 0.61–1.24)
GFR, Estimated: >60 mL/min (ref >60)
Glucose, Bld: 98 mg/dL (ref 70–99)
Potassium: 4.1 mmol/L (ref 3.5–5.1)
Sodium: 138 mmol/L (ref 135–145)

## 2020-03-06 MED ORDER — ORPHENADRINE CITRATE ER 100 MG PO TB12: 100.0000 mg | ORAL_TABLET | Freq: Two times a day (BID) | ORAL | 0 refills | Status: DC

## 2020-03-06 MED ORDER — DICLOFENAC SODIUM 1 % EX GEL: 2.0000 g | Freq: Four times a day (QID) | CUTANEOUS | 0 refills | Status: DC

## 2020-03-06 MED ORDER — LIDOCAINE 5 % EX PTCH
1.0000 | MEDICATED_PATCH | CUTANEOUS | Status: DC
  Administered 2020-03-06: 1 via TRANSDERMAL
  Filled 2020-03-06: qty 1

## 2020-03-06 MED ORDER — LIDOCAINE 5 % EX PTCH: 1.0000 | MEDICATED_PATCH | CUTANEOUS | 0 refills | Status: DC

## 2020-03-06 NOTE — ED Triage Notes (Signed)
Pt c/o neck pain and left arm numbness that started Monday.

## 2020-03-06 NOTE — Discharge Instructions (Addendum)
Follow up with your PCP as discussed. Take Norflex as needed as prescribed for pain not controlled with Tylenol and topical Diclofenac. Apply Lidoderm patch as prescribed- these are also available over the counter if they are too expensive by prescription.  Warm compresses for 20 minutes at a time followed by gentle stretching as discussed.

## 2020-03-06 NOTE — ED Provider Notes (Signed)
[facility] EMERGENCY DEPARTMENT Provider Note   CSN: [id number] Arrival date & time: 03/06/20  1711     History Chief Complaint  Patient presents with  . Extremity Weakness    Howard Bishop is a 44 y.o. male.  44 year old male presents with complaint of pain in his left side neck and arm.  Patient states symptoms started on Monday, describes an electrical shooting pain from the left side of his neck extending to his left thumb.  Denies weakness in the arm, falls or injuries or any other complaints or concerns at this time.  Patient is taking Motrin without improvement in his pain.        Past Medical History:  Diagnosis Date  . ADHD (attention deficit hyperactivity disorder)   . Anxiety   . Bipolar disorder (HCC)   . Chronic kidney disease   . Gallstone   . Headache(784.0)   . Hepatitis C antibody test positive   . Hx of concussion   . Hypertension   . PTSD (post-traumatic stress disorder)   . Schizoaffective disorder (HCC)   . Seizures [facility])     Patient Active Problem List   Diagnosis Date Noted  . Seizures (HCC) 04/01/2015  . Alcohol abuse 01/18/2014  . 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 PTSD (post-traumatic stress disorder)   . Hepatitis C antibody test positive   . WPYKDXIP(382.5)     Past Surgical History:  Procedure Laterality Date  . CHOLECYSTECTOMY    . RENAL ARTERY STENT         Family History  Problem Relation Age of Onset  . Drug abuse Mother   . Alcohol abuse Father   . Anxiety disorder Sister   . Drug abuse Sister     Social History   Tobacco Use  . Smoking status: Current Every Day Smoker    Packs/day: 1.00    Types: Cigarettes    Start date: 03/29/1990  . Smokeless tobacco: Former Neurosurgeon    Types: Engineer, drilling  . Vaping Use: Never used  Substance Use Topics  . Alcohol use: Not Currently    Alcohol/week: 0.0 standard drinks    Comment: not since 2018  . Drug use: Yes    Frequency: 7.0  times per week    Types: Marijuana    Comment: 3 grams per day    Home Medications Prior to Admission medications   Medication Sig Start Date End Date Taking? Authorizing Provider  chlordiazePOXIDE (LIBRIUM) 25 MG capsule 50mg  PO TID x 1D, then 25-50mg  PO BID X 1D, then 25-50mg  PO QD X 1D 01/11/17   01/13/17, MD  diclofenac Sodium (VOLTAREN) 1 % GEL Apply 2 g topically 4 (four) times daily. 03/06/20   14/9/21, PA-C  doxycycline (VIBRAMYCIN) 100 MG capsule Take 1 capsule (100 mg total) by mouth 2 (two) times daily. 07/10/17   Triplett, Tammy, PA-C  HYDROcodone-acetaminophen (NORCO/VICODIN) 5-325 MG tablet Take one tab po q 4 hrs prn pain 10/16/17   Triplett, Tammy, PA-C  lidocaine (LIDODERM) 5 % Place 1 patch onto the skin daily. Remove & Discard patch within 12 hours or as directed by MD 03/06/20   14/9/21 A, PA-C  naproxen sodium (ALEVE) 220 MG tablet Take 440 mg by mouth daily as needed (for pain).    [provider]  orphenadrine (NORFLEX) 100 MG tablet Take 1 tablet (100 mg total) by mouth 2 (two) times daily. 03/06/20   14/9/21,  Gerome Apley, PA-C    Allergies    Asa [aspirin], Penicillins, Tomato, Ambien [zolpidem], and Ultram [tramadol]  Review of Systems   Review of Systems  Constitutional: Negative for fever.  Eyes: Negative for visual disturbance.  Cardiovascular: Negative for chest pain.  Musculoskeletal: Positive for neck pain. Negative for arthralgias and gait problem.  Skin: Negative for color change, rash and wound.  Allergic/Immunologic: Negative for immunocompromised state.  Neurological: Positive for numbness. Negative for speech difficulty and weakness.  Psychiatric/Behavioral: Negative for confusion.  All other systems reviewed and are negative.   Physical Exam Updated Vital Signs BP (!) 171/86 (BP Location: Right Arm)   Pulse 96   Temp 98.8 F (37.1 C) (Oral)   Resp 18   Ht 6\' 3"  (1.905 m)   Wt 85.7 kg   SpO2 98%   BMI 23.62 kg/m    Physical Exam Vitals and nursing note reviewed.  Constitutional:      General: He is not in acute distress.    Appearance: He is well-developed and well-nourished. He is not diaphoretic.  HENT:     Head: Normocephalic and atraumatic.  Cardiovascular:     Rate and Rhythm: Normal rate and regular rhythm.     Pulses: Normal pulses.     Heart sounds: Normal heart sounds.  Pulmonary:     Effort: Pulmonary effort is normal.     Breath sounds: Normal breath sounds.  Musculoskeletal:        General: Tenderness present. No swelling or deformity.     Cervical back: Tenderness present. No bony tenderness.       Back:  Skin:    General: Skin is warm and dry.     Findings: No erythema or rash.  Neurological:     Mental Status: He is alert and oriented to person, place, and time.     Sensory: No sensory deficit.     Motor: No weakness.     Deep Tendon Reflexes:     Reflex Scores:      Tricep reflexes are 1+ on the right side and 1+ on the left side.      Bicep reflexes are 2+ on the right side and 2+ on the left side.      Brachioradialis reflexes are 1+ on the right side and 1+ on the left side. Psychiatric:        Mood and Affect: Mood and affect normal.        Behavior: Behavior normal.     ED Results / Procedures / Treatments   Labs (all labs ordered are listed, but only abnormal results are displayed) Labs Reviewed  BASIC METABOLIC PANEL  CBC  URINALYSIS, ROUTINE W REFLEX MICROSCOPIC  CBG MONITORING, ED    EKG None  Radiology No results found.  Procedures Procedures (including critical care time)  Medications Ordered in ED Medications  lidocaine (LIDODERM) 5 % 1 patch (has no administration in time range)    ED Course  I have reviewed the triage vital signs and the nursing notes.  Pertinent labs & imaging results that were available during my care of the patient were reviewed by me and considered in my medical decision making (see chart for  details).  Clinical Course as of 03/06/20 2242  Thu Mar 06, 2020  2452 44 year old male with complaint of left side neck pain extending to left thumb without fall or injury or complaints of weakness.  On exam found have tenderness to left paraspinous cervical spine extending into  left trapezius and along medial border of the left scapula.  There is no arm pain, normal range of motion of the shoulder, elbow, wrist.  Normal upper extremity strength and reflexes.  Strong radial pulse present. Discussed with patient likely cervical radiculopathy, no history of trauma associated with this, plan is to discharge with Lidoderm patch, prescription for Norflex and diclofenac topical.  Patient plans to follow-up with his PCP. [LM]  2240 Labs completed in triage included CBC and CMP which are all within normal range. [LM]    Clinical Course User Index [LM] Alden Hipp   MDM Rules/Calculators/A&P                          Final Clinical Impression(s) / ED Diagnoses Final diagnoses:  Cervical radiculopathy    Rx / DC Orders ED Discharge Orders         Ordered    orphenadrine (NORFLEX) 100 MG tablet  2 times daily        03/06/20 2230    lidocaine (LIDODERM) 5 %  Every 24 hours        03/06/20 2230    diclofenac Sodium (VOLTAREN) 1 % GEL  4 times daily        03/06/20 2230           Alden Hipp 03/06/20 2242    Benjiman Core, MD 03/06/20 2317

## 2020-03-09 DIAGNOSIS — M5030 Other cervical disc degeneration, unspecified cervical region: Secondary | ICD-10-CM | POA: Diagnosis not present

## 2020-03-09 DIAGNOSIS — M50322 Other cervical disc degeneration at C5-C6 level: Secondary | ICD-10-CM | POA: Diagnosis not present

## 2020-03-09 DIAGNOSIS — J321 Chronic frontal sinusitis: Secondary | ICD-10-CM | POA: Diagnosis not present

## 2020-03-09 DIAGNOSIS — M50323 Other cervical disc degeneration at C6-C7 level: Secondary | ICD-10-CM | POA: Diagnosis not present

## 2020-03-09 DIAGNOSIS — R200 Anesthesia of skin: Secondary | ICD-10-CM | POA: Diagnosis not present

## 2020-03-09 DIAGNOSIS — M79602 Pain in left arm: Secondary | ICD-10-CM | POA: Diagnosis not present

## 2020-03-09 DIAGNOSIS — J0110 Acute frontal sinusitis, unspecified: Secondary | ICD-10-CM | POA: Diagnosis not present

## 2020-03-09 DIAGNOSIS — R202 Paresthesia of skin: Secondary | ICD-10-CM | POA: Diagnosis not present

## 2020-08-16 ENCOUNTER — Other Ambulatory Visit: Payer: Self-pay

## 2020-08-16 ENCOUNTER — Emergency Department (HOSPITAL_COMMUNITY)
Admission: EM | Admit: 2020-08-16 | Discharge: 2020-08-16 | Disposition: A | Discharge status: Home / Self Care | Payer: BC Managed Care – PPO | Attending: Emergency Medicine | Admitting: Emergency Medicine

## 2020-08-16 ENCOUNTER — Encounter (HOSPITAL_COMMUNITY): Payer: Self-pay | Admitting: Emergency Medicine

## 2020-08-16 DIAGNOSIS — R197 Diarrhea, unspecified: Secondary | ICD-10-CM | POA: Diagnosis not present

## 2020-08-16 DIAGNOSIS — N189 Chronic kidney disease, unspecified: Secondary | ICD-10-CM | POA: Insufficient documentation

## 2020-08-16 DIAGNOSIS — I129 Hypertensive chronic kidney disease with stage 1 through stage 4 chronic kidney disease, or unspecified chronic kidney disease: Secondary | ICD-10-CM | POA: Insufficient documentation

## 2020-08-16 DIAGNOSIS — W57XXXA Bitten or stung by nonvenomous insect and other nonvenomous arthropods, initial encounter: Secondary | ICD-10-CM

## 2020-08-16 DIAGNOSIS — F17210 Nicotine dependence, cigarettes, uncomplicated: Secondary | ICD-10-CM | POA: Insufficient documentation

## 2020-08-16 DIAGNOSIS — S30861A Insect bite (nonvenomous) of abdominal wall, initial encounter: Secondary | ICD-10-CM | POA: Diagnosis not present

## 2020-08-16 DIAGNOSIS — S30865A Insect bite (nonvenomous) of unspecified external genital organs, male, initial encounter: Secondary | ICD-10-CM | POA: Insufficient documentation

## 2020-08-16 HISTORY — DX: Other cervical disc degeneration, unspecified cervical region: M50.30

## 2020-08-16 MED ORDER — DOXYCYCLINE HYCLATE 100 MG PO CAPS: 100.0000 mg | ORAL_CAPSULE | Freq: Two times a day (BID) | ORAL | 0 refills | Status: DC

## 2020-08-16 NOTE — ED Provider Notes (Signed)
[facility] EMERGENCY DEPARTMENT Provider Note   CSN: [id number] Arrival date & time: 08/16/20  3235     History Chief Complaint  Patient presents with  . Wound Check    Howard Bishop is a 46 y.o. male.  HPI      Howard Bishop is a 45 y.o. male who presents to the Emergency Department complaining of soreness of his right groin for 1 week.  Symptoms began after removing a tick from the area.  States he pulled the tick with his fingers.  Since that time, he has noticed some mild drainage of the area that has been green in color and surrounding redness.  Few days ago he states the area had some bluish-purple discoloration that has since resolved.  He notes low-grade fever at home of 100.32 days ago.  He took ibuprofen at that time and has not had a fever since.  He also notes having some watery loose stools that have been nonbloody or black.  No nausea or vomiting.  No chills, joint pain or rash.  Pain does not radiate into his penis or scrotum.  He also notes having some small "blisters" in his mouth that have been transient.  No oral pain or bleeding.      Past Medical History:  Diagnosis Date  . ADHD (attention deficit hyperactivity disorder)   . Anxiety   . Bipolar disorder (HCC)   . Chronic kidney disease   . DDD (degenerative disc disease), cervical   . Gallstone   . Headache(784.0)   . Hepatitis C antibody test positive   . Hx of concussion   . Hypertension   . PTSD (post-traumatic stress disorder)   . Schizoaffective disorder (HCC)   . Seizures [facility])     Patient Active Problem List   Diagnosis Date Noted  . Seizures (HCC) 04/01/2015  . Alcohol abuse 01/18/2014  . 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 PTSD (post-traumatic stress disorder)   . Hepatitis C antibody test positive   . TDDUKGUR(427.0)     Past Surgical History:  Procedure Laterality Date  . CHOLECYSTECTOMY    . RENAL ARTERY STENT         Family History   Problem Relation Age of Onset  . Drug abuse Mother   . Alcohol abuse Father   . Anxiety disorder Sister   . Drug abuse Sister     Social History   Tobacco Use  . Smoking status: Current Every Day Smoker    Packs/day: 1.00    Types: Cigarettes    Start date: 03/29/1990  . Smokeless tobacco: Former Neurosurgeon    Types: Engineer, drilling  . Vaping Use: Never used  Substance Use Topics  . Alcohol use: Not Currently    Alcohol/week: 0.0 standard drinks    Comment: not since 2018  . Drug use: Yes    Frequency: 7.0 times per week    Types: Marijuana    Comment: 3 grams per day    Home Medications Prior to Admission medications   Medication Sig Start Date End Date Taking? Authorizing Provider  chlordiazePOXIDE (LIBRIUM) 25 MG capsule 50mg  PO TID x 1D, then 25-50mg  PO BID X 1D, then 25-50mg  PO QD X 1D 01/11/17   01/13/17, MD  diclofenac Sodium (VOLTAREN) 1 % GEL Apply 2 g topically 4 (four) times daily. 03/06/20   14/9/21, PA-C  doxycycline (VIBRAMYCIN) 100 MG capsule Take 1 capsule (100 mg  total) by mouth 2 (two) times daily. 07/10/17   Saron Tweed, PA-C  HYDROcodone-acetaminophen (NORCO/VICODIN) 5-325 MG tablet Take one tab po q 4 hrs prn pain 10/16/17   Roopa Graver, PA-C  lidocaine (LIDODERM) 5 % Place 1 patch onto the skin daily. Remove & Discard patch within 12 hours or as directed by MD 03/06/20   Army Melia A, PA-C  naproxen sodium (ALEVE) 220 MG tablet Take 440 mg by mouth daily as needed (for pain).    [provider]  orphenadrine (NORFLEX) 100 MG tablet Take 1 tablet (100 mg total) by mouth 2 (two) times daily. 03/06/20   Jeannie Fend, PA-C    Allergies    Asa [aspirin], Penicillins, Tomato, Ambien [zolpidem], and Ultram [tramadol]  Review of Systems   Review of Systems  Constitutional: Positive for fever. Negative for activity change, appetite change, chills and fatigue.  HENT: Negative for facial swelling, sore throat and trouble swallowing.    Respiratory: Negative for cough, chest tightness and shortness of breath.   Cardiovascular: Negative for chest pain.  Gastrointestinal: Positive for diarrhea. Negative for abdominal pain, blood in stool, nausea and vomiting.  Genitourinary: Negative for dysuria, flank pain and hematuria.  Musculoskeletal: Negative for arthralgias, back pain, myalgias, neck pain and neck stiffness.  Skin: Positive for wound. Negative for rash.       Tick bite right groin  Neurological: Negative for dizziness, weakness, numbness and headaches.  Hematological: Does not bruise/bleed easily.    Physical Exam Updated Vital Signs BP (!) 152/99 (BP Location: Left Arm)   Pulse 92   Temp 98.4 F (36.9 C) (Oral)   Resp 18   Ht 6\' 3"  (1.905 m)   Wt 85.7 kg   SpO2 100%   BMI 23.62 kg/m   Physical Exam Vitals and nursing note reviewed.  Constitutional:      Appearance: Normal appearance. He is not ill-appearing.  HENT:     Mouth/Throat:     Mouth: Mucous membranes are moist.  Cardiovascular:     Rate and Rhythm: Normal rate and regular rhythm.     Pulses: Normal pulses.  Pulmonary:     Effort: Pulmonary effort is normal. No respiratory distress.  Chest:     Chest wall: No tenderness.  Abdominal:     General: There is no distension.     Palpations: Abdomen is soft.     Tenderness: There is no abdominal tenderness.  Musculoskeletal:        General: Normal range of motion.  Skin:    General: Skin is warm.     Capillary Refill: Capillary refill takes less than 2 seconds.     Findings: Wound present. No abscess or rash. Rash is not pustular or vesicular.     Comments: Single, erythematous macule to the right groin.  No edema, drainage or fluctuance.  No bull's eye rash  Neurological:     General: No focal deficit present.     Mental Status: He is alert.     Sensory: No sensory deficit.     Motor: No weakness.     ED Results / Procedures / Treatments   Labs (all labs ordered are listed, but  only abnormal results are displayed) Labs Reviewed - No data to display  EKG None  Radiology No results found.  Procedures Procedures   Medications Ordered in ED Medications - No data to display  ED Course  I have reviewed the triage vital signs and the nursing notes.  Pertinent labs & imaging results that were available during my care of the patient were reviewed by me and considered in my medical decision making (see chart for details).    MDM Rules/Calculators/A&P                          Patient here for evaluation of a recent tick bite to the right groin.  Tick was removed 6 days ago.  Since then patient has noticed watery brown stools, low-grade fever 2 days ago with resolution after ibuprofen.  No fever since.   redness around the site with blue discoloration that has since resolved.  No joint pains, abdominal pain nausea or vomiting. Reported blisters to the gingiva felt to be unrelated and secondary to poor dentition.  No evidence of dental abscess.   Patient is well-appearing.  Nontoxic.  Afebrile here.  Abdomen and groin nontender on exam.  No evidence of abscess.  Will start patient on doxycycline.  Ibuprofen or Tylenol if needed for fever.  He is agreeable to close follow-up with PCP and return precautions were also discussed.  Patient appears appropriate for discharge at this time.  The patient appears reasonably screened and/or stabilized for discharge and I doubt any other medical condition or other [facility] requiring further screening, evaluation, or treatment in the ED at this time prior to discharge.  Final Clinical Impression(s) / ED Diagnoses Final diagnoses:  Tick bite of groin, initial encounter    Rx / DC Orders ED Discharge Orders    None       Pauline Aus, PA-C 08/16/20 1055    Long, Arlyss Repress, MD 08/21/20 [id number]

## 2020-08-16 NOTE — Discharge Instructions (Addendum)
Clean the tick bite area with mild soap and water.  Apply 1% hydrocortisone cream twice daily and cover with a Band-Aid.  Take the antibiotic as directed until its finished.  Also, you may rinse your mouth with a 50-50 mixture of hydrogen peroxide and water.  Continue Tylenol or ibuprofen if needed for fever.  Return to the emergency department for any new or worsening symptoms.

## 2020-08-16 NOTE — ED Triage Notes (Signed)
Patient c/o wound to right groin with "green drainage." Per patient removed tick 5-6 days ago and now has open wound that is red with purple coloring around it. Redness noted, no active drainage at this time.  Patient states temp of 100.3 Thursday in which he took ibuprofen and has not had fever since. Denies any nausea or vomiting does report some diarrhea. Patient also states has had x2 abscess in mouth that have ruptured since removing tick.

## 2021-03-12 DIAGNOSIS — G40909 Epilepsy, unspecified, not intractable, without status epilepticus: Secondary | ICD-10-CM | POA: Diagnosis not present

## 2021-03-12 DIAGNOSIS — K219 Gastro-esophageal reflux disease without esophagitis: Secondary | ICD-10-CM | POA: Diagnosis not present

## 2021-03-12 DIAGNOSIS — Z8619 Personal history of other infectious and parasitic diseases: Secondary | ICD-10-CM | POA: Diagnosis not present

## 2021-03-12 DIAGNOSIS — I10 Essential (primary) hypertension: Secondary | ICD-10-CM | POA: Diagnosis not present

## 2021-03-18 DIAGNOSIS — R3915 Urgency of urination: Secondary | ICD-10-CM | POA: Diagnosis not present

## 2021-03-18 DIAGNOSIS — Z886 Allergy status to analgesic agent status: Secondary | ICD-10-CM | POA: Diagnosis not present

## 2021-03-18 DIAGNOSIS — I700 Atherosclerosis of aorta: Secondary | ICD-10-CM | POA: Diagnosis not present

## 2021-03-18 DIAGNOSIS — Z87442 Personal history of urinary calculi: Secondary | ICD-10-CM | POA: Diagnosis not present

## 2021-03-18 DIAGNOSIS — R109 Unspecified abdominal pain: Secondary | ICD-10-CM | POA: Diagnosis not present

## 2021-03-18 DIAGNOSIS — Z885 Allergy status to narcotic agent status: Secondary | ICD-10-CM | POA: Diagnosis not present

## 2021-03-18 DIAGNOSIS — Z880 Allergy status to penicillin: Secondary | ICD-10-CM | POA: Diagnosis not present

## 2021-03-18 DIAGNOSIS — Z9049 Acquired absence of other specified parts of digestive tract: Secondary | ICD-10-CM | POA: Diagnosis not present

## 2021-03-18 DIAGNOSIS — R140 Abdominal distension (gaseous): Secondary | ICD-10-CM | POA: Diagnosis not present

## 2021-03-18 DIAGNOSIS — N3289 Other specified disorders of bladder: Secondary | ICD-10-CM | POA: Diagnosis not present

## 2021-03-18 DIAGNOSIS — R319 Hematuria, unspecified: Secondary | ICD-10-CM | POA: Diagnosis not present

## 2021-03-18 DIAGNOSIS — R350 Frequency of micturition: Secondary | ICD-10-CM | POA: Diagnosis not present

## 2021-03-18 DIAGNOSIS — R110 Nausea: Secondary | ICD-10-CM | POA: Diagnosis not present

## 2021-03-25 DIAGNOSIS — H6692 Otitis media, unspecified, left ear: Secondary | ICD-10-CM | POA: Diagnosis not present

## 2021-03-25 DIAGNOSIS — J019 Acute sinusitis, unspecified: Secondary | ICD-10-CM | POA: Diagnosis not present

## 2021-04-03 ENCOUNTER — Emergency Department (HOSPITAL_COMMUNITY)
Admission: EM | Admit: 2021-04-03 | Discharge: 2021-04-03 | Disposition: A | Discharge status: Home / Self Care | Payer: BC Managed Care – PPO | Attending: Emergency Medicine | Admitting: Emergency Medicine

## 2021-04-03 ENCOUNTER — Emergency Department (HOSPITAL_COMMUNITY): Payer: BC Managed Care – PPO

## 2021-04-03 ENCOUNTER — Encounter (HOSPITAL_COMMUNITY): Payer: Self-pay | Admitting: *Deleted

## 2021-04-03 DIAGNOSIS — I700 Atherosclerosis of aorta: Secondary | ICD-10-CM | POA: Diagnosis not present

## 2021-04-03 DIAGNOSIS — X58XXXA Exposure to other specified factors, initial encounter: Secondary | ICD-10-CM | POA: Insufficient documentation

## 2021-04-03 DIAGNOSIS — R319 Hematuria, unspecified: Secondary | ICD-10-CM | POA: Diagnosis not present

## 2021-04-03 DIAGNOSIS — R3989 Other symptoms and signs involving the genitourinary system: Secondary | ICD-10-CM | POA: Diagnosis not present

## 2021-04-03 DIAGNOSIS — S3991XA Unspecified injury of abdomen, initial encounter: Secondary | ICD-10-CM | POA: Diagnosis not present

## 2021-04-03 DIAGNOSIS — S39011A Strain of muscle, fascia and tendon of abdomen, initial encounter: Secondary | ICD-10-CM | POA: Insufficient documentation

## 2021-04-03 DIAGNOSIS — S76212A Strain of adductor muscle, fascia and tendon of left thigh, initial encounter: Secondary | ICD-10-CM

## 2021-04-03 DIAGNOSIS — S76812A Strain of other specified muscles, fascia and tendons at thigh level, left thigh, initial encounter: Secondary | ICD-10-CM | POA: Diagnosis not present

## 2021-04-03 DIAGNOSIS — R109 Unspecified abdominal pain: Secondary | ICD-10-CM | POA: Diagnosis not present

## 2021-04-03 LAB — URINALYSIS, ROUTINE W REFLEX MICROSCOPIC
Bilirubin Urine: NEGATIVE
Glucose, UA: NEGATIVE mg/dL
Hgb urine dipstick: NEGATIVE
Ketones, ur: NEGATIVE mg/dL
Leukocytes,Ua: NEGATIVE
Nitrite: NEGATIVE
Protein, ur: NEGATIVE mg/dL
Specific Gravity, Urine: 1.001 — ABNORMAL LOW (ref 1.005–1.030)
pH: 6 (ref 5.0–8.0)

## 2021-04-03 MED ORDER — HYDROCODONE-ACETAMINOPHEN 5-325 MG PO TABS: 1.0000 | ORAL_TABLET | Freq: Four times a day (QID) | ORAL | 0 refills | Status: DC | PRN

## 2021-04-03 MED ORDER — HYDROCODONE-ACETAMINOPHEN 5-325 MG PO TABS
1.0000 | ORAL_TABLET | Freq: Once | ORAL | Status: AC
  Administered 2021-04-03: 1 via ORAL
  Filled 2021-04-03: qty 1

## 2021-04-03 NOTE — ED Triage Notes (Signed)
Pain in left lower quadrant radiating into left groin area, pain in scrotal area onset yesterday, worse today

## 2021-04-03 NOTE — ED Provider Notes (Signed)
[facility] EMERGENCY DEPARTMENT Provider Note   CSN: [id number] Arrival date & time: 04/03/21  1622     History  Chief Complaint  Patient presents with   Abdominal Pain    Howard Bishop is a 46 y.o. male.  Patient complains of pain in his left groin going into his left flank and testicle on the left side.  Patient has no medical problems  The history is provided by the patient and medical records. No language interpreter was used.  Abdominal Pain Pain location:  LUQ Pain quality: aching   Pain radiates to:  Does not radiate Pain severity:  Moderate Onset quality:  Sudden Timing:  Constant Progression:  Worsening Chronicity:  New Context: not alcohol use   Relieved by:  Nothing Worsened by:  Nothing Ineffective treatments:  None tried Associated symptoms: no chest pain, no cough, no diarrhea, no fatigue and no hematuria       Home Medications Prior to Admission medications   Medication Sig Start Date End Date Taking? Authorizing Provider  HYDROcodone-acetaminophen (NORCO/VICODIN) 5-325 MG tablet Take 1 tablet by mouth every 6 (six) hours as needed. 04/03/21  Yes Milton Sobotta, MD  chlordiazePOXIDE (LIBRIUM) 25 MG capsule 50mg  PO TID x 1D, then 25-50mg  PO BID X 1D, then 25-50mg  PO QD X 1D 01/11/17   Forde Dandy, MD  diclofenac Sodium (VOLTAREN) 1 % GEL Apply 2 g topically 4 (four) times daily. 03/06/20   Tacy Learn, PA-C  doxycycline (VIBRAMYCIN) 100 MG capsule Take 1 capsule (100 mg total) by mouth 2 (two) times daily. 08/16/20   Triplett, Tammy, PA-C  lidocaine (LIDODERM) 5 % Place 1 patch onto the skin daily. Remove & Discard patch within 12 hours or as directed by MD 03/06/20   Suella Broad A, PA-C  naproxen sodium (ALEVE) 220 MG tablet Take 440 mg by mouth daily as needed (for pain).    [provider]  orphenadrine (NORFLEX) 100 MG tablet Take 1 tablet (100 mg total) by mouth 2 (two) times daily. 03/06/20   Tacy Learn, PA-C      Allergies     Asa [aspirin], Penicillins, Tomato, Ambien [zolpidem], and Ultram [tramadol]    Review of Systems   Review of Systems  Constitutional:  Negative for appetite change and fatigue.  HENT:  Negative for congestion, ear discharge and sinus pressure.   Eyes:  Negative for discharge.  Respiratory:  Negative for cough.   Cardiovascular:  Negative for chest pain.  Gastrointestinal:  Positive for abdominal pain. Negative for diarrhea.  Genitourinary:  Negative for frequency and hematuria.  Musculoskeletal:  Negative for back pain.  Skin:  Negative for rash.  Neurological:  Negative for seizures and headaches.  Psychiatric/Behavioral:  Negative for hallucinations.    Physical Exam Updated Vital Signs BP (!) 151/98    Pulse 77    Temp 98.1 F (36.7 C) (Oral)    Resp 16    SpO2 100%  Physical Exam Vitals and nursing note reviewed.  Constitutional:      Appearance: He is well-developed.  HENT:     Head: Normocephalic.     Mouth/Throat:     Mouth: Mucous membranes are moist.  Eyes:     General: No scleral icterus.    Conjunctiva/sclera: Conjunctivae normal.  Neck:     Thyroid: No thyromegaly.  Cardiovascular:     Rate and Rhythm: Normal rate and regular rhythm.     Heart sounds: No murmur heard.   No friction  rub. No gallop.  Pulmonary:     Breath sounds: No stridor. No wheezing or rales.  Chest:     Chest wall: No tenderness.  Abdominal:     General: There is no distension.     Tenderness: There is no abdominal tenderness. There is no rebound.  Genitourinary:    Comments: Tenderness left groin questionable inguinal hernia Musculoskeletal:        General: Normal range of motion.     Cervical back: Neck supple.  Lymphadenopathy:     Cervical: No cervical adenopathy.  Skin:    Findings: No erythema or rash.  Neurological:     Mental Status: He is alert and oriented to person, place, and time.     Motor: No abnormal muscle tone.     Coordination: Coordination normal.   Psychiatric:        Behavior: Behavior normal.    ED Results / Procedures / Treatments   Labs (all labs ordered are listed, but only abnormal results are displayed) Labs Reviewed  URINALYSIS, ROUTINE W REFLEX MICROSCOPIC - Abnormal; Notable for the following components:      Result Value   Color, Urine COLORLESS (*)    Specific Gravity, Urine 1.001 (*)    All other components within normal limits    EKG None  Radiology CT Renal Stone Study  Result Date: 04/03/2021 CLINICAL DATA:  Bladder pain, right flank pain, hematuria EXAM: CT ABDOMEN AND PELVIS WITHOUT CONTRAST TECHNIQUE: Multidetector CT imaging of the abdomen and pelvis was performed following the standard protocol without IV contrast. COMPARISON:  03/18/2021 FINDINGS: Lower chest: No acute pleural or parenchymal lung disease. Hepatobiliary: Gallbladder is surgically absent. Focal fatty infiltration along the falciform ligament. Otherwise unremarkable unenhanced appearance of the liver. Pancreas: Unremarkable unenhanced appearance. Spleen: Unremarkable unenhanced appearance. Adrenals/Urinary Tract: No urinary tract calculi or obstructive uropathy within either kidney. 9 mm right renal cortical hypodensity too small to characterize, statistically likely a small cyst. The adrenals and bladder are unremarkable. Stomach/Bowel: No bowel obstruction or ileus. Normal appendix right lower quadrant. No bowel wall thickening or inflammatory change. Vascular/Lymphatic: Aortic atherosclerosis. No enlarged abdominal or pelvic lymph nodes. Reproductive: Prostate is unremarkable. Other: No free fluid or free intraperitoneal gas. No abdominal wall hernia. Musculoskeletal: No acute or destructive bony lesions. Reconstructed images demonstrate no additional findings. IMPRESSION: 1. No urinary tract calculi or obstructive uropathy. 2. No acute intra-abdominal or intrapelvic process identified on this unenhanced exam. 3.  Aortic Atherosclerosis  (ICD10-I70.0). Electronically Signed   By: Randa Ngo M.D.   On: 04/03/2021 19:01    Procedures Procedures    Medications Ordered in ED Medications  HYDROcodone-acetaminophen (NORCO/VICODIN) 5-325 MG per tablet 1 tablet (1 tablet Oral Given 04/03/21 1943)    ED Course/ Medical Decision Making/ A&P                           Medical Decision Making  Patient with most likely a left groin strain.  Possible small inguinal hernia.  He is placed on Vicodin and will follow up with PCP This patient presents to the ED for concern of abdominal pain, this involves an extensive number of treatment options, and is a complaint that carries with it a high risk of complications and morbidity.  The differential diagnosis includes appendicitis, inguinal hernia, kidney stone   Co morbidities that complicate the patient evaluation  No medical problem   Additional history obtained:  Additional history obtained from patient  External records from outside source obtained and reviewed including hospital records reviewed   Lab Tests:  I Ordered, and personally interpreted labs.  The pertinent results include: Urinalysis which was normal   Imaging Studies ordered:  I ordered imaging studies including CT renal I independently visualized and interpreted imaging which showed unremarkable I agree with the radiologist interpretation   Cardiac Monitoring:  Patient on a monitor  Medicines ordered and prescription drug management:  I ordered medication including Vicodin for pain Reevaluation of the patient after these medicines showed that the patient improved I have reviewed the patients home medicines and have made adjustments as needed   Test Considered:  Ultrasound of scrotum   Critical Interventions:  Pain medicine   Consultations Obtained:  No consultants   Problem List / ED Course:  Inguinal pain, most likely inguinal strain or possible hernia   Reevaluation:  After  the interventions noted above, I reevaluated the patient and found that they have :improved   Social Determinants of Health:  None   Dispostion:  After consideration of the diagnostic results and the patients response to treatment, I feel that the patent would benefit from patient be discharged home with Vicodin and follow-up with general surgery.         Final Clinical Impression(s) / ED Diagnoses Final diagnoses:  Strain of groin, left, initial encounter    Rx / DC Orders ED Discharge Orders          Ordered    HYDROcodone-acetaminophen (NORCO/VICODIN) 5-325 MG tablet  Every 6 hours PRN        04/03/21 2013              Milton Baar, MD 04/05/21 1105

## 2021-04-03 NOTE — ED Notes (Signed)
Pt d/c home per MD order. Discharge summary reviewed, pt verbalizes understanding. Ambulatory off unit. No s/s of acute distress noted. Pt reports wife is discharge ride home.

## 2021-04-03 NOTE — Discharge Instructions (Signed)
Follow-up with your family doctor next week for recheck.  No heavy lifting

## 2021-04-10 DIAGNOSIS — S76012A Strain of muscle, fascia and tendon of left hip, initial encounter: Secondary | ICD-10-CM | POA: Diagnosis not present

## 2021-04-26 ENCOUNTER — Emergency Department (HOSPITAL_COMMUNITY)
Admission: EM | Admit: 2021-04-26 | Discharge: 2021-04-26 | Disposition: A | Discharge status: Home / Self Care | Payer: BC Managed Care – PPO | Attending: Emergency Medicine | Admitting: Emergency Medicine

## 2021-04-26 ENCOUNTER — Encounter (HOSPITAL_COMMUNITY): Payer: Self-pay | Admitting: Emergency Medicine

## 2021-04-26 ENCOUNTER — Other Ambulatory Visit: Payer: Self-pay

## 2021-04-26 ENCOUNTER — Emergency Department (HOSPITAL_COMMUNITY): Payer: BC Managed Care – PPO

## 2021-04-26 DIAGNOSIS — I700 Atherosclerosis of aorta: Secondary | ICD-10-CM | POA: Diagnosis not present

## 2021-04-26 DIAGNOSIS — Z79899 Other long term (current) drug therapy: Secondary | ICD-10-CM | POA: Diagnosis not present

## 2021-04-26 DIAGNOSIS — K4091 Unilateral inguinal hernia, without obstruction or gangrene, recurrent: Secondary | ICD-10-CM | POA: Diagnosis not present

## 2021-04-26 DIAGNOSIS — N189 Chronic kidney disease, unspecified: Secondary | ICD-10-CM | POA: Insufficient documentation

## 2021-04-26 DIAGNOSIS — R109 Unspecified abdominal pain: Secondary | ICD-10-CM | POA: Diagnosis not present

## 2021-04-26 DIAGNOSIS — I129 Hypertensive chronic kidney disease with stage 1 through stage 4 chronic kidney disease, or unspecified chronic kidney disease: Secondary | ICD-10-CM | POA: Insufficient documentation

## 2021-04-26 LAB — CBC WITH DIFFERENTIAL/PLATELET
Abs Immature Granulocytes: 0.02 10*3/uL (ref 0.00–0.07)
Basophils Absolute: 0.1 10*3/uL (ref 0.0–0.1)
Basophils Relative: 1 %
Eosinophils Absolute: 0.3 10*3/uL (ref 0.0–0.5)
Eosinophils Relative: 3 %
HCT: 45.6 % (ref 39.0–52.0)
Hemoglobin: 16.1 g/dL (ref 13.0–17.0)
Immature Granulocytes: 0 %
Lymphocytes Relative: 32 %
Lymphs Abs: 2.6 10*3/uL (ref 0.7–4.0)
MCH: 32.5 pg (ref 26.0–34.0)
MCHC: 35.3 g/dL (ref 30.0–36.0)
MCV: 91.9 fL (ref 80.0–100.0)
Monocytes Absolute: 0.5 10*3/uL (ref 0.1–1.0)
Monocytes Relative: 7 %
Neutro Abs: 4.5 10*3/uL (ref 1.7–7.7)
Neutrophils Relative %: 57 %
Platelets: 256 10*3/uL (ref 150–400)
RBC: 4.96 MIL/uL (ref 4.22–5.81)
RDW: 12.3 % (ref 11.5–15.5)
WBC: 7.9 10*3/uL (ref 4.0–10.5)
nRBC: 0 % (ref 0.0–0.2)

## 2021-04-26 LAB — BASIC METABOLIC PANEL
Anion gap: 6 (ref 5–15)
BUN: 10 mg/dL (ref 6–20)
CO2: 23 mmol/L (ref 22–32)
Calcium: 9.5 mg/dL (ref 8.9–10.3)
Chloride: 105 mmol/L (ref 98–111)
Creatinine, Ser: 0.83 mg/dL (ref 0.61–1.24)
GFR, Estimated: >60 mL/min (ref >60)
Glucose, Bld: 101 mg/dL — ABNORMAL HIGH (ref 70–99)
Potassium: 3.8 mmol/L (ref 3.5–5.1)
Sodium: 134 mmol/L — ABNORMAL LOW (ref 135–145)

## 2021-04-26 MED ORDER — IOHEXOL 300 MG/ML  SOLN
100.0000 mL | Freq: Once | INTRAMUSCULAR | Status: AC | PRN
  Administered 2021-04-26: 100 mL via INTRAVENOUS

## 2021-04-26 MED ORDER — OXYCODONE-ACETAMINOPHEN 5-325 MG PO TABS
1.0000 | ORAL_TABLET | Freq: Once | ORAL | Status: AC
Start: 2021-04-26 — End: 2021-04-26
  Administered 2021-04-26: 1 via ORAL
  Filled 2021-04-26: qty 1

## 2021-04-26 MED ORDER — OXYCODONE-ACETAMINOPHEN 5-325 MG PO TABS: 1.0000 | ORAL_TABLET | Freq: Four times a day (QID) | ORAL | 0 refills | Status: AC | PRN

## 2021-04-26 NOTE — ED Triage Notes (Signed)
Pt to the ED with complaints of left groin pain and a diagnosed hernia. Pt was told by his physician to come to the ED with increased pain.

## 2021-04-26 NOTE — ED Provider Notes (Signed)
[facility] EMERGENCY DEPARTMENT Provider Note   CSN: [id number] Arrival date & time: 04/26/21  1407     History  Chief Complaint  Patient presents with   Inguinal Hernia    Howard Bishop is a 46 y.o. male. PMH includes HTN, ETOH use disorder, PTSD, Hepatitis C, CKD, and Bipolar disorder.  Presents the emergency department with complaints of left inguinal hernia severe pain.  He states that he was diagnosed with a left inguinal hernia at the beginning of January when he was having some mild to moderate pain in the area that was intermittent.  He had a CT Noncon at that time which did not reveal that hernia, however is thought that he may have had 1 present on exam.  He followed up with his primary care provider who confirmed with him that was a inguinal hernia.  He told him to come to the emergency department if his pain ever got to the point where it was severe.  Patient states that over the past 4 days it has been hurting much more than normal.  He says that it is much worse than it was prior and has been unbearable at times where he has been crying.  The pain medication that he has been taking at home which is hydrocodone has not been helping his symptoms.  He denies any change in bowel movements, but does say it is difficult for him to urinate ever since the pain is gotten more severe.  HPI     Home Medications Prior to Admission medications   Medication Sig Start Date End Date Taking? Authorizing Provider  oxyCODONE-acetaminophen (PERCOCET/ROXICET) 5-325 MG tablet Take 1 tablet by mouth every 6 (six) hours as needed for up to 5 days for severe pain. 04/26/21 05/01/21 Yes Kailo Kosik, Finis Bud, PA-C  chlordiazePOXIDE (LIBRIUM) 25 MG capsule 50mg  PO TID x 1D, then 25-50mg  PO BID X 1D, then 25-50mg  PO QD X 1D 01/11/17   01/13/17, MD  diclofenac Sodium (VOLTAREN) 1 % GEL Apply 2 g topically 4 (four) times daily. 03/06/20   14/9/21, PA-C  doxycycline (VIBRAMYCIN) 100 MG capsule  Take 1 capsule (100 mg total) by mouth 2 (two) times daily. 08/16/20   Triplett, Tammy, PA-C  HYDROcodone-acetaminophen (NORCO/VICODIN) 5-325 MG tablet Take 1 tablet by mouth every 6 (six) hours as needed. 04/03/21   06/01/21, MD  lidocaine (LIDODERM) 5 % Place 1 patch onto the skin daily. Remove & Discard patch within 12 hours or as directed by MD 03/06/20   14/9/21 A, PA-C  lisinopril (ZESTRIL) 10 MG tablet lisinopril 10 mg tablet    [provider]  methocarbamol (ROBAXIN) 500 MG tablet methocarbamol 500 mg tablet  TAKE 1 TABLET BY MOUTH TWICE DAILY FOR 10 DAYS    [provider]  naproxen sodium (ALEVE) 220 MG tablet Take 440 mg by mouth daily as needed (for pain).    [provider]  orphenadrine (NORFLEX) 100 MG tablet Take 1 tablet (100 mg total) by mouth 2 (two) times daily. 03/06/20   14/9/21, PA-C  predniSONE (DELTASONE) 20 MG tablet prednisone 20 mg tablet  TAKE 2 TABLETS BY MOUTH ONCE DAILY FOR 3 DAYS    [provider]  sulfamethoxazole-trimethoprim (BACTRIM DS) 800-160 MG tablet sulfamethoxazole 800 mg-trimethoprim 160 mg tablet  TAKE 1 TABLET BY MOUTH TWICE DAILY FOR 5 DAYS    [provider]      Allergies    Asa [aspirin],  Penicillins, Tomato, Ambien [zolpidem], and Ultram [tramadol]    Review of Systems   Review of Systems  Gastrointestinal:  Negative for constipation, nausea and vomiting.  Genitourinary:  Positive for difficulty urinating. Negative for scrotal swelling and testicular pain.       Left inguinal hernia  All other systems reviewed and are negative.  Physical Exam Updated Vital Signs BP (!) 150/101    Pulse 83    Temp 98.3 F (36.8 C) (Oral)    Resp 18    SpO2 98%  Physical Exam Vitals and nursing note reviewed. Exam conducted with a chaperone present.  Constitutional:      General: He is not in acute distress.    Appearance: Normal appearance. He is well-developed. He is not ill-appearing,  toxic-appearing or diaphoretic.  HENT:     Head: Normocephalic and atraumatic.     Nose: No nasal deformity.     Mouth/Throat:     Lips: Pink. No lesions.  Eyes:     General: Gaze aligned appropriately. No scleral icterus.       Right eye: No discharge.        Left eye: No discharge.     Conjunctiva/sclera: Conjunctivae normal.     Right eye: Right conjunctiva is not injected. No exudate or hemorrhage.    Left eye: Left conjunctiva is not injected. No exudate or hemorrhage. Pulmonary:     Effort: Pulmonary effort is normal. No respiratory distress.  Abdominal:     General: Abdomen is flat. There is no distension.     Palpations: Abdomen is soft. There is no mass.     Tenderness: There is no abdominal tenderness. There is no right CVA tenderness, left CVA tenderness, guarding or rebound.  Genitourinary:    Comments: Indirect inguinal hernia present on the left side. Severe pain when I palpate this. Feels firm. There is no testicular or scrotal tenderness on exam.  No swelling or abnormal appearance of this area Skin:    General: Skin is warm and dry.  Neurological:     Mental Status: He is alert and oriented to person, place, and time.  Psychiatric:        Mood and Affect: Mood normal.        Speech: Speech normal.        Behavior: Behavior normal. Behavior is cooperative.    ED Results / Procedures / Treatments   Labs (all labs ordered are listed, but only abnormal results are displayed) Labs Reviewed  BASIC METABOLIC PANEL - Abnormal; Notable for the following components:      Result Value   Sodium 134 (*)    Glucose, Bld 101 (*)    All other components within normal limits  CBC WITH DIFFERENTIAL/PLATELET    EKG None  Radiology CT Abdomen Pelvis W Contrast  Result Date: 04/26/2021 CLINICAL DATA:  Hernia with worsening left groin pain concern for strangulation EXAM: CT ABDOMEN AND PELVIS WITH CONTRAST TECHNIQUE: Multidetector CT imaging of the abdomen and pelvis was  performed using the standard protocol following bolus administration of intravenous contrast. RADIATION DOSE REDUCTION: This exam was performed according to the departmental dose-optimization program which includes automated exposure control, adjustment of the mA and/or kV according to patient size and/or use of iterative reconstruction technique. CONTRAST:  100mL OMNIPAQUE IOHEXOL 300 MG/ML  SOLN COMPARISON:  CT April 03, 2021. FINDINGS: Lower chest: No acute abnormality. Hepatobiliary: Nodular hypodensity along the anterior aspect of the falciform ligament for instance on image 24/2 is  in a classic location for focal fatty deposition. Gallbladder surgically absent. No biliary ductal dilation. Pancreas: No pancreatic ductal dilation or evidence of acute inflammation. Unchanged 6 mm dystrophic calcification along the inferior aspect of the pancreatic tail. Spleen: Within normal limits. Adrenals/Urinary Tract: Bilateral adrenal glands appear normal. No hydronephrosis. Bilateral subcentimeter hypodense renal lesions are technically too small to accurately characterize but statistically likely reflect cysts. Symmetric enhancement of the bilateral kidneys. Urinary bladder is unremarkable for degree of distension. Stomach/Bowel: No enteric contrast was administered. Stomach is unremarkable for degree of distension. No pathologic dilation of large or small bowel. The appendix and terminal ileum appear normal. No evidence of acute bowel inflammation. Vascular/Lymphatic: Scattered aortic atherosclerosis without aneurysmal dilation. No pathologically enlarged abdominal or pelvic lymph nodes. Reproductive: Prostate is unremarkable. Other: Tiny fat containing left inguinal hernia. No abdominopelvic ascites. Musculoskeletal: No acute or significant osseous findings. IMPRESSION: No acute abdominopelvic findings. Tiny fat containing left inguinal hernia. Aortic Atherosclerosis (ICD10-I70.0). Electronically Signed   By: Maudry Mayhew M.D.   On: 04/26/2021 16:16    Procedures Procedures   Medications Ordered in ED Medications  oxyCODONE-acetaminophen (PERCOCET/ROXICET) 5-325 MG per tablet 1 tablet (1 tablet Oral Given 04/26/21 1516)  iohexol (OMNIPAQUE) 300 MG/ML solution 100 mL (100 mLs Intravenous Contrast Given 04/26/21 1548)    ED Course/ Medical Decision Making/ A&P                           Medical Decision Making Problems Addressed: Unilateral recurrent inguinal hernia without obstruction or gangrene: chronic illness or injury with exacerbation, progression, or side effects of treatment  Amount and/or Complexity of Data Reviewed External Data Reviewed: radiology and notes.    Details: reviewed previous CT scan Labs: ordered. Decision-making details documented in ED Course. Radiology: ordered and independent interpretation performed. Decision-making details documented in ED Course.  Risk Prescription drug management.   This is a 46 y.o. male with a PMH of HTN, ETOH use disorder, PTSD, Hepatitis C, CKD, and Bipolar disorder who presents to the ED with severe pain of left inguinal hernia. Pain has been ongoing and worsening for 3-4 days.  There are no indications of bowel obstruction on history.  On examination of the hernia, patient has severe pain on palpation of a indirect inguinal hernia.  I doubt that this is testicular torsion, epididymitis, or other testicular etiology given there is no pain, swelling, or abnormal appearance of testicle or scrotal area.  Palpable inguinal hernia was felt on exam.  Plan to obtain CT abdomen pelvis for evaluation of incarcerated or strangulated hernia.  I personally reviewed all laboratory work and imaging. Abnormal results outlined below. Labs look unremarkable. CT does show inguinal hernia present but no signs of strangulation or incarceration. Clinically, patient appears well. His pain is more under control. I think that he can follow up with general surgery  outpatient. Plan to give him another course of pain medication.  Portions of this note were generated with Scientist, clinical (histocompatibility and immunogenetics). Dictation errors may occur despite best attempts at proofreading.  Final Clinical Impression(s) / ED Diagnoses Final diagnoses:  Unilateral recurrent inguinal hernia without obstruction or gangrene    Rx / DC Orders ED Discharge Orders          Ordered    oxyCODONE-acetaminophen (PERCOCET/ROXICET) 5-325 MG tablet  Every 6 hours PRN        04/26/21 1708  Therese SarahLoeffler, Earlie Schank C, PA-C 04/26/21 2305    Pricilla LovelessGoldston, Scott, MD 04/28/21 [phone]

## 2021-04-26 NOTE — Discharge Instructions (Signed)
Your CT scan did reveal an inguinal hernia. There was no evidence of strangulation or incarceration meaning that the hernia is not causing any emergency conditions. I have prescribed you a short course of pain medication. You should follow up with the general surgery team that you already have an appointment scheduled for.

## 2021-05-06 DIAGNOSIS — N451 Epididymitis: Secondary | ICD-10-CM | POA: Diagnosis not present

## 2021-05-06 DIAGNOSIS — N50812 Left testicular pain: Secondary | ICD-10-CM | POA: Diagnosis not present

## 2021-05-19 ENCOUNTER — Ambulatory Visit: Payer: BC Managed Care – PPO | Admitting: Surgery

## 2021-05-28 DIAGNOSIS — N50811 Right testicular pain: Secondary | ICD-10-CM | POA: Diagnosis not present

## 2021-06-01 DIAGNOSIS — Z118 Encounter for screening for other infectious and parasitic diseases: Secondary | ICD-10-CM | POA: Diagnosis not present

## 2021-06-01 DIAGNOSIS — Z113 Encounter for screening for infections with a predominantly sexual mode of transmission: Secondary | ICD-10-CM | POA: Diagnosis not present

## 2021-06-01 DIAGNOSIS — N50811 Right testicular pain: Secondary | ICD-10-CM | POA: Diagnosis not present

## 2021-06-09 ENCOUNTER — Ambulatory Visit: Payer: BC Managed Care – PPO | Admitting: General Surgery

## 2021-06-29 DIAGNOSIS — N50811 Right testicular pain: Secondary | ICD-10-CM | POA: Diagnosis not present

## 2021-07-27 DIAGNOSIS — N50811 Right testicular pain: Secondary | ICD-10-CM | POA: Diagnosis not present

## 2021-07-27 DIAGNOSIS — I861 Scrotal varices: Secondary | ICD-10-CM | POA: Diagnosis not present

## 2021-11-02 DIAGNOSIS — I861 Scrotal varices: Secondary | ICD-10-CM | POA: Diagnosis not present

## 2021-11-02 DIAGNOSIS — N50811 Right testicular pain: Secondary | ICD-10-CM | POA: Diagnosis not present

## 2021-11-02 DIAGNOSIS — Z3009 Encounter for other general counseling and advice on contraception: Secondary | ICD-10-CM | POA: Diagnosis not present

## 2021-12-27 HISTORY — PX: VASECTOMY: SHX75

## 2022-01-26 DIAGNOSIS — I861 Scrotal varices: Secondary | ICD-10-CM | POA: Diagnosis not present

## 2022-01-26 DIAGNOSIS — Z302 Encounter for sterilization: Secondary | ICD-10-CM | POA: Diagnosis not present

## 2022-03-04 ENCOUNTER — Encounter: Payer: Self-pay | Admitting: Emergency Medicine

## 2022-03-04 ENCOUNTER — Ambulatory Visit
Admission: EM | Admit: 2022-03-04 | Discharge: 2022-03-04 | Disposition: A | Discharge status: Home / Self Care | Payer: BC Managed Care – PPO | Attending: Family Medicine | Admitting: Family Medicine

## 2022-03-04 ENCOUNTER — Ambulatory Visit (INDEPENDENT_AMBULATORY_CARE_PROVIDER_SITE_OTHER): Payer: BC Managed Care – PPO

## 2022-03-04 DIAGNOSIS — M79671 Pain in right foot: Secondary | ICD-10-CM

## 2022-03-04 DIAGNOSIS — M19071 Primary osteoarthritis, right ankle and foot: Secondary | ICD-10-CM | POA: Diagnosis not present

## 2022-03-04 NOTE — ED Provider Notes (Signed)
[facility]    CSN: [id number] Arrival date & time: 03/04/22  1608      History   Chief Complaint No chief complaint on file.   HPI Howard Bishop is a 46 y.o. male.   Patient presents for right foot pain that began yesterday after accidentally kicking a step that is wooden.  He denies any injury to the skin on his foot.  Reports since that time, his foot has been red and a little bit swollen and is painful when he walks.  He is concerned the foot is broken.  Denies any numbness or tingling in his toes, however he does have pain when he tries to move his toes.  Has not take anything for pain so far.  Has tried ice and elevation which helps minimally.    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 Authorizing Provider  chlordiazePOXIDE (LIBRIUM) 25 MG capsule 50mg  PO TID x 1D, then 25-50mg  PO BID X 1D, then 25-50mg  PO QD X 1D 01/11/17   01/13/17, MD  diclofenac Sodium (VOLTAREN) 1 % GEL Apply 2 g topically 4 (four) times daily. 03/06/20   14/9/21, PA-C  doxycycline (VIBRAMYCIN) 100 MG capsule Take 1 capsule (100 mg total)  by mouth 2 (two) times daily. 08/16/20   Triplett, Tammy, PA-C  HYDROcodone-acetaminophen (NORCO/VICODIN) 5-325 MG tablet Take 1 tablet by mouth every 6 (six) hours as needed. 04/03/21   06/01/21, MD  lidocaine (LIDODERM) 5 % Place 1 patch onto the skin daily. Remove & Discard patch within 12 hours or as directed by MD 03/06/20   14/9/21 A, PA-C  lisinopril (ZESTRIL) 10 MG tablet lisinopril 10 mg tablet    [provider]  methocarbamol (ROBAXIN) 500 MG tablet methocarbamol 500 mg tablet  TAKE 1 TABLET BY MOUTH TWICE DAILY FOR 10 DAYS    [provider]  naproxen sodium (ALEVE) 220 MG tablet Take 440 mg by mouth daily as needed (for pain).    [provider]  orphenadrine (NORFLEX) 100 MG tablet Take 1 tablet (100 mg total) by mouth 2 (two) times daily. 03/06/20   14/9/21, PA-C  predniSONE (DELTASONE) 20 MG tablet prednisone 20 mg tablet  TAKE 2 TABLETS BY MOUTH ONCE DAILY FOR 3 DAYS    [provider]  sulfamethoxazole-trimethoprim (BACTRIM DS) 800-160 MG tablet sulfamethoxazole 800 mg-trimethoprim 160 mg tablet  TAKE 1 TABLET BY MOUTH TWICE DAILY FOR 5 DAYS  [provider]    Family History Family History  Problem Relation Age of Onset   Drug abuse Mother    Alcohol abuse Father    Anxiety disorder Sister    Drug abuse Sister     Social History Social History   Tobacco Use   Smoking status: Every Day    Packs/day: 1.00    Types: Cigarettes    Start date: 03/29/1990   Smokeless tobacco: Former    Types: Associate Professor Use: Never used  Substance Use Topics   Alcohol use: Not Currently    Alcohol/week: 0.0 standard drinks of alcohol    Comment: not since 2018   Drug use: Yes    Frequency: 7.0 times per week    Types: Marijuana    Comment: 3 grams per day     Allergies   Asa [aspirin], Penicillins, Tomato, Ambien [zolpidem], and Ultram [tramadol]   Review of Systems Review of Systems Per  HPI  Physical Exam Triage Vital Signs ED Triage Vitals  Enc Vitals Group     BP 03/04/22 1743 (!) 157/85     Pulse Rate 03/04/22 1743 93     Resp 03/04/22 1743 18     Temp 03/04/22 1743 97.9 F (36.6 C)     Temp Source 03/04/22 1743 Oral     SpO2 03/04/22 1743 98 %     Weight --      Height --      Head Circumference --      Peak Flow --      Pain Score 03/04/22 1744 8     Pain Loc --      Pain Edu? --      Excl. in GC? --    No data found.  Updated Vital Signs BP (!) 157/85 (BP Location: Right Arm)   Pulse 93   Temp 97.9 F (36.6 C) (Oral)   Resp 18   SpO2 98%   Visual Acuity Right Eye Distance:   Left Eye Distance:   Bilateral Distance:    Right Eye Near:   Left Eye Near:    Bilateral Near:     Physical Exam Vitals and nursing note reviewed.  Constitutional:      General: He is not in acute distress.    Appearance: Normal appearance. He is not toxic-appearing.  Pulmonary:     Effort: Pulmonary effort is normal. No respiratory distress.  Musculoskeletal:     Right lower leg: No edema.     Left lower leg: No edema.       Feet:     Comments: Inspection: Mild erythema with swelling to the left distal foot and first phalanges; no obvious deformities  Palpation: Left dorsal foot tender to palpation in approximately area marked; first through third phalanges are also tender to palpation; no obvious deformities palpated ROM: Full ROM to left foot, all 5 phalanges Strength: 5/5 left ankle Neurovascular: neurovascularly intact in left lower extremity   Skin:    General: Skin is warm and dry.     Capillary Refill: Capillary refill takes less than 2 seconds.     Coloration: Skin is not jaundiced or pale.     Findings: No erythema.  Neurological:     Mental Status: He is alert and oriented to person, place, and time.  Psychiatric:        Behavior: Behavior is cooperative.      UC Treatments / Results  Labs (all labs  ordered are listed, but only abnormal  results are displayed) Labs Reviewed - No data to display  EKG   Radiology DG Foot Complete Right  Result Date: 03/04/2022 CLINICAL DATA:  Pain and swelling to right foot. Kicked a step last night. Great toe and dorsal foot pain and swelling. EXAM: RIGHT FOOT COMPLETE - 3+ VIEW COMPARISON:  Right foot radiographs 05/11/2018 FINDINGS: Mild-to-moderate second and third tarsometatarsal joint space narrowing. Tiny plantar and posterior calcaneal heel spurs. Unchanged tiny chronic ossicle at the posterior dorsal aspect of the navicular, chronic. Mild adjacent distal dorsal talar neck degenerative osteophytosis. No acute fracture or dislocation. 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 No acute fracture is seen, with attention to the great toe. Electronically Signed   By: Neita Garnet M.D.   On: 03/04/2022 18:06    Procedures Procedures (including critical care time)  Medications Ordered in UC Medications - No data to display  Initial Impression / Assessment and Plan / UC Course  I have reviewed the triage vital signs and the nursing notes.  Pertinent labs & imaging results that were available during my care of the patient were reviewed by me and considered in my medical decision making (see chart for details).   Patient is well-appearing, normotensive, afebrile, not tachycardic, not tachypneic, oxygenating well on room air.    Right foot pain Secondary to blunt trauma X-ray imaging negative for acute fracture Ace wrap provided to patient Recommended rest, ice, compression, elevation Can use Tylenol ibuprofen as needed for pain Follow-up with orthopedic provider if no improvement after 1 to 2 weeks  The patient was given the opportunity to ask questions.  All questions answered to their satisfaction.  The patient is in agreement to this plan.    Final Clinical Impressions(s) / UC Diagnoses   Final diagnoses:  Right foot pain     Discharge  Instructions      No broken bones today and left foot x-ray.  We have put an Ace wrap on your foot-please wear this is much as you can to help provide compression and stability.  Keep your foot elevated when you are sitting down, use ice 15 minutes on, 45 minutes off, and Tylenol/ibuprofen as needed for pain.  If pain persists more than 1 to 2 weeks without improvement, follow-up with orthopedic provider.  Contact information is attached.    ED Prescriptions   None    PDMP not reviewed this encounter.   Valentino Nose, NP 03/04/22 1821

## 2022-03-04 NOTE — Discharge Instructions (Signed)
No broken bones today and left foot x-ray.  We have put an Ace wrap on your foot-please wear this is much as you can to help provide compression and stability.  Keep your foot elevated when you are sitting down, use ice 15 minutes on, 45 minutes off, and Tylenol/ibuprofen as needed for pain.  If pain persists more than 1 to 2 weeks without improvement, follow-up with orthopedic provider.  Contact information is attached.

## 2022-03-04 NOTE — ED Triage Notes (Signed)
Kicked a step last night.  Pain to right foot around great toe and and top of foot.  Foot swollen.

## 2022-03-09 ENCOUNTER — Ambulatory Visit
Admission: RE | Admit: 2022-03-09 | Discharge: 2022-03-09 | Disposition: A | Discharge status: Home / Self Care | Payer: BC Managed Care – PPO | Source: Ambulatory Visit

## 2022-03-09 VITALS — BP 155/93 | HR 98 | Temp 97.9°F | Resp 18

## 2022-03-09 DIAGNOSIS — J209 Acute bronchitis, unspecified: Secondary | ICD-10-CM | POA: Diagnosis not present

## 2022-03-09 DIAGNOSIS — J4521 Mild intermittent asthma with (acute) exacerbation: Secondary | ICD-10-CM | POA: Diagnosis not present

## 2022-03-09 DIAGNOSIS — B9789 Other viral agents as the cause of diseases classified elsewhere: Secondary | ICD-10-CM

## 2022-03-09 DIAGNOSIS — J329 Chronic sinusitis, unspecified: Secondary | ICD-10-CM | POA: Diagnosis not present

## 2022-03-09 MED ORDER — GUAIFENESIN ER 600 MG PO TB12: 600.0000 mg | ORAL_TABLET | Freq: Two times a day (BID) | ORAL | 0 refills | Status: DC

## 2022-03-09 MED ORDER — PROMETHAZINE-DM 6.25-15 MG/5ML PO SYRP: 5.0000 mL | ORAL_SOLUTION | Freq: Four times a day (QID) | ORAL | 0 refills | Status: DC | PRN

## 2022-03-09 MED ORDER — ALBUTEROL SULFATE HFA 108 (90 BASE) MCG/ACT IN AERS: 2.0000 | INHALATION_SPRAY | RESPIRATORY_TRACT | 0 refills | Status: AC | PRN

## 2022-03-09 MED ORDER — PREDNISONE 20 MG PO TABS: 40.0000 mg | ORAL_TABLET | Freq: Every day | ORAL | 0 refills | Status: DC

## 2022-03-09 NOTE — ED Provider Notes (Signed)
[facility]    CSN: [id number] Arrival date & time: 03/09/22  1548      History   Chief Complaint Chief Complaint  Patient presents with   Wheezing    Coughing running nose ear stopped  up - Entered by patient   Appointment    1600     HPI Howard Bishop is a 46 y.o. male.   Patient presenting today with 1 week history of cough, nasal congestion, ear pressure and popping and now chest tightness, shortness of breath with exertion.  Denies fever, chills, body aches, chest pain, abdominal pain, nausea vomiting or diarrhea.  Taking over-the-counter cough medicine with mild relief.  He of asthma as a child and longtime cigarette smoker so thinks he has COPD but has not been formally diagnosed.  No known inhalers at home.    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 Authorizing Provider  acetaminophen (TYLENOL) 500 MG tablet Take 500 mg by mouth every 6 (six) hours as needed.   Yes [provider]  albuterol (VENTOLIN HFA) 108 (90 Base) MCG/ACT inhaler Inhale 2 puffs into the lungs every 4 (four) hours as needed for  wheezing or shortness of breath. 03/09/22  Yes Particia Nearing, PA-C  guaiFENesin (MUCINEX) 600 MG 12 hr tablet Take 1 tablet (600 mg total) by mouth 2 (two) times daily. 03/09/22  Yes Particia Nearing, PA-C  predniSONE (DELTASONE) 20 MG tablet Take 2 tablets (40 mg total) by mouth daily with breakfast. 03/09/22  Yes Particia Nearing, PA-C  promethazine-dextromethorphan (PROMETHAZINE-DM) 6.25-15 MG/5ML syrup Take 5 mLs by mouth 4 (four) times daily as needed. 03/09/22  Yes Particia Nearing, PA-C  chlordiazePOXIDE (LIBRIUM) 25 MG capsule 50mg  PO TID x 1D, then 25-50mg  PO BID X 1D, then 25-50mg  PO QD X 1D 01/11/17   01/13/17, MD  diclofenac Sodium (VOLTAREN) 1 % GEL Apply 2 g topically 4 (four) times daily. 03/06/20   14/9/21, PA-C  doxycycline (VIBRAMYCIN) 100 MG capsule Take 1 capsule (100 mg total) by mouth 2 (two) times daily. 08/16/20   Triplett, Tammy, PA-C  HYDROcodone-acetaminophen (NORCO/VICODIN) 5-325 MG tablet Take 1 tablet by mouth every 6 (six) hours as needed. 04/03/21   06/01/21, MD  lidocaine (LIDODERM) 5 % Place 1 patch onto the skin daily. Remove & Discard patch within 12 hours or as directed by MD 03/06/20   14/9/21 A, PA-C  lisinopril (ZESTRIL) 10 MG tablet lisinopril 10  mg tablet    [provider]  methocarbamol (ROBAXIN) 500 MG tablet methocarbamol 500 mg tablet  TAKE 1 TABLET BY MOUTH TWICE DAILY FOR 10 DAYS    [provider]  naproxen sodium (ALEVE) 220 MG tablet Take 440 mg by mouth daily as needed (for pain).    [provider]  orphenadrine (NORFLEX) 100 MG tablet Take 1 tablet (100 mg total) by mouth 2 (two) times daily. 03/06/20   Jeannie Fend, PA-C  predniSONE (DELTASONE) 20 MG tablet prednisone 20 mg tablet  TAKE 2 TABLETS BY MOUTH ONCE DAILY FOR 3 DAYS    [provider]  sulfamethoxazole-trimethoprim (BACTRIM DS) 800-160 MG tablet sulfamethoxazole 800 mg-trimethoprim 160 mg tablet   TAKE 1 TABLET BY MOUTH TWICE DAILY FOR 5 DAYS    [provider]    Family History Family History  Problem Relation Age of Onset   Drug abuse Mother    Alcohol abuse Father    Anxiety disorder Sister    Drug abuse Sister     Social History Social History   Tobacco Use   Smoking status: Every Day    Packs/day: 1.00    Types: Cigarettes    Start date: 03/29/1990   Smokeless tobacco: Former    Types: Associate Professor Use: Never used  Substance Use Topics   Alcohol use: Not Currently    Alcohol/week: 0.0 standard drinks of alcohol    Comment: not since 2018   Drug use: Yes    Frequency: 7.0 times per week    Types: Marijuana    Comment: 3 grams per day     Allergies   Aspirin, Penicillins, Tomato, Ambien [zolpidem], Bee venom, Ketorolac, Paroxetine hcl, Tramadol hcl, and Ultram [tramadol]   Review of Systems Review of Systems PER HPI  Physical Exam Triage Vital Signs ED Triage Vitals  Enc Vitals Group     BP 03/09/22 1634 (!) 155/93     Pulse Rate 03/09/22 1634 98     Resp 03/09/22 1634 18     Temp 03/09/22 1634 97.9 F (36.6 C)     Temp Source 03/09/22 1634 Oral     SpO2 03/09/22 1634 98 %     Weight --      Height --      Head Circumference --      Peak Flow --      Pain Score 03/09/22 1632 0     Pain Loc --      Pain Edu? --      Excl. in GC? --    No data found.  Updated Vital Signs BP (!) 155/93 (BP Location: Right Arm)   Pulse 98   Temp 97.9 F (36.6 C) (Oral)   Resp 18   SpO2 98%   Visual Acuity Right Eye Distance:   Left Eye Distance:   Bilateral Distance:    Right Eye Near:   Left Eye Near:    Bilateral Near:     Physical Exam Vitals and nursing note reviewed.  Constitutional:      Appearance: He is well-developed.  HENT:     Head: Atraumatic.     Right Ear: External ear normal.     Left Ear: External ear normal.     Nose: Rhinorrhea present.     Mouth/Throat:     Pharynx: Posterior oropharyngeal erythema  present. No oropharyngeal exudate.  Eyes:     Conjunctiva/sclera: Conjunctivae normal.  Pupils: Pupils are equal, round, and reactive to light.  Cardiovascular:     Rate and Rhythm: Normal rate and regular rhythm.  Pulmonary:     Effort: Pulmonary effort is normal. No respiratory distress.     Breath sounds: Wheezing present. No rales.     Comments: Trace wheezes Musculoskeletal:        General: Normal range of motion.     Cervical back: Normal range of motion and neck supple.  Lymphadenopathy:     Cervical: No cervical adenopathy.  Skin:    General: Skin is warm and dry.  Neurological:     Mental Status: He is alert and oriented to person, place, and time.  Psychiatric:        Behavior: Behavior normal.      UC Treatments / Results  Labs (all labs ordered are listed, but only abnormal results are displayed) Labs Reviewed - No data to display  EKG   Radiology No results found.  Procedures Procedures (including critical care time)  Medications Ordered in UC Medications - No data to display  Initial Impression / Assessment and Plan / UC Course  I have reviewed the triage vital signs and the nursing notes.  Pertinent labs & imaging results that were available during my care of the patient were reviewed by me and considered in my medical decision making (see chart for details).     Suspect viral sinusitis and bronchitis as an asthma exacerbation.  Treat with prednisone, albuterol as needed, Phenergan DM, Mucinex.  Supportive home care and return precautions reviewed.  Final Clinical Impressions(s) / UC Diagnoses   Final diagnoses:  Acute bronchitis, unspecified organism  Viral sinusitis  Mild intermittent asthma with acute exacerbation   Discharge Instructions   None    ED Prescriptions     Medication Sig Dispense Auth. Provider   predniSONE (DELTASONE) 20 MG tablet Take 2 tablets (40 mg total) by mouth daily with breakfast. 10 tablet Particia Nearing, PA-C   albuterol (VENTOLIN HFA) 108 (90 Base) MCG/ACT inhaler Inhale 2 puffs into the lungs every 4 (four) hours as needed for wheezing or shortness of breath. 18 g Particia Nearing, PA-C   guaiFENesin (MUCINEX) 600 MG 12 hr tablet Take 1 tablet (600 mg total) by mouth 2 (two) times daily. 20 tablet Particia Nearing, [place]   promethazine-dextromethorphan (PROMETHAZINE-DM) 6.25-15 MG/5ML syrup Take 5 mLs by mouth 4 (four) times daily as needed. 100 mL Particia Nearing, [place]      PDMP not reviewed this encounter.   Particia Nearing, [place] 03/09/22 [phone]

## 2022-03-09 NOTE — ED Triage Notes (Signed)
Pt reports coughing, nasal congestion, shortness of breath on exertion, ears stopped  up x 1 week. OTC cough meds gives some relief.

## 2022-04-08 DIAGNOSIS — Z760 Encounter for issue of repeat prescription: Secondary | ICD-10-CM | POA: Diagnosis not present

## 2022-04-08 DIAGNOSIS — I10 Essential (primary) hypertension: Secondary | ICD-10-CM | POA: Diagnosis not present

## 2022-04-08 DIAGNOSIS — H6692 Otitis media, unspecified, left ear: Secondary | ICD-10-CM | POA: Diagnosis not present

## 2022-04-08 DIAGNOSIS — J45998 Other asthma: Secondary | ICD-10-CM | POA: Diagnosis not present

## 2022-06-06 IMAGING — CT CT RENAL STONE PROTOCOL
2 of 4 series · 16 of 46 positions shown, 18 images · non-contrast
Comparison: 03/18/2021

CLINICAL DATA: Bladder pain, right flank pain, hematuria

EXAM:
CT ABDOMEN AND PELVIS WITHOUT CONTRAST
TECHNIQUE: Multidetector CT imaging of the abdomen and pelvis was performed
following the standard protocol without IV contrast.

[Series 2: axial st · axial · 0.69mm/px · z∈[+948,+1353]mm · 13 of 91 slices shown, 15 images]
[im 5/91  soft-tissue]
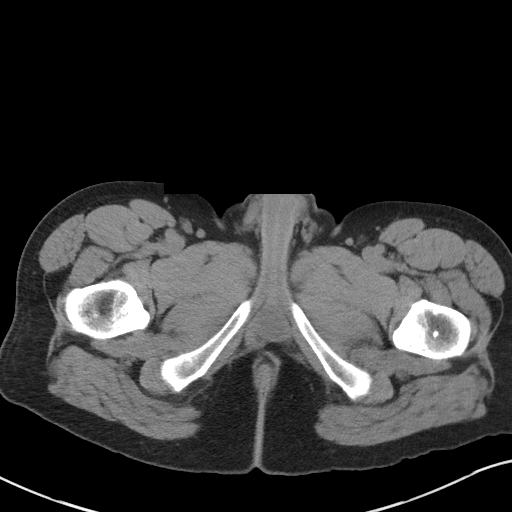
[im 5/91  bone]
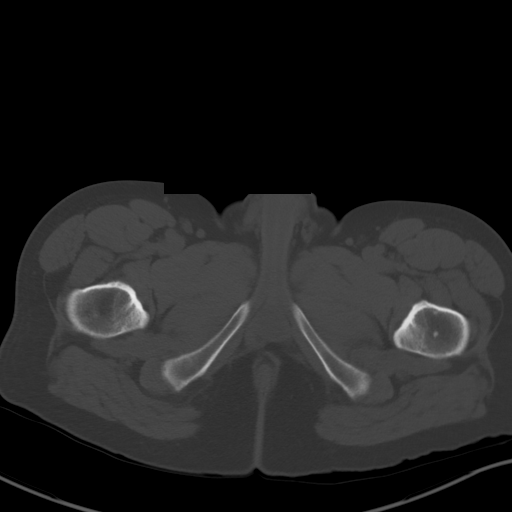
[im 13/91  soft-tissue]
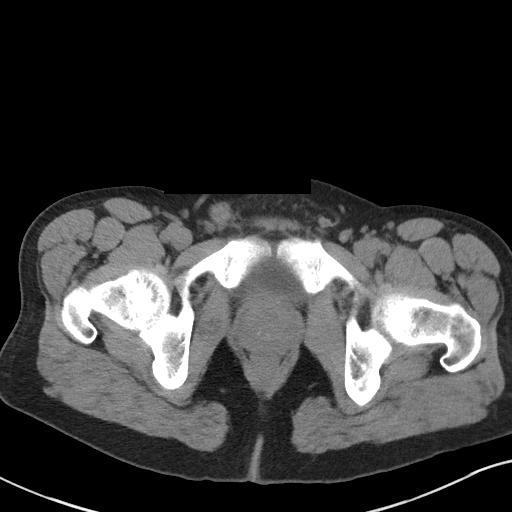
[im 21/91  soft-tissue]
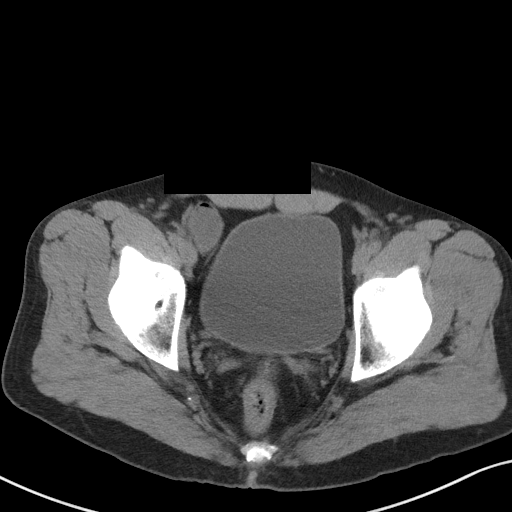
[im 25/91  soft-tissue]
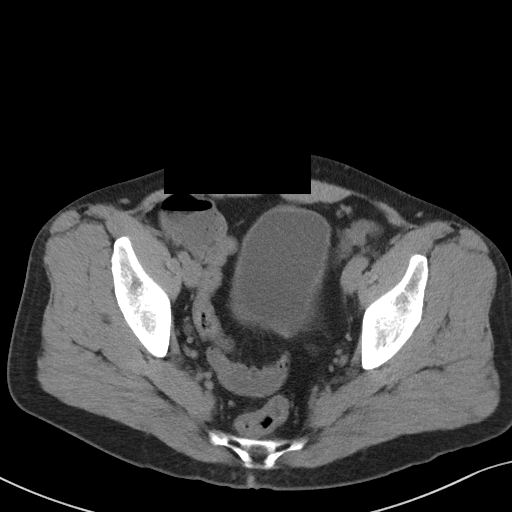
[im 33/91  soft-tissue]
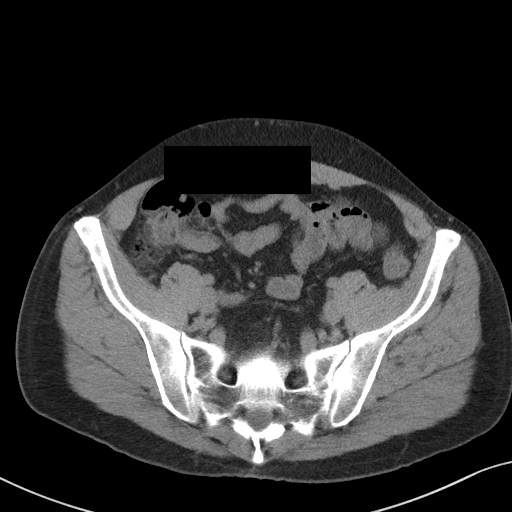
[im 37/91  soft-tissue]
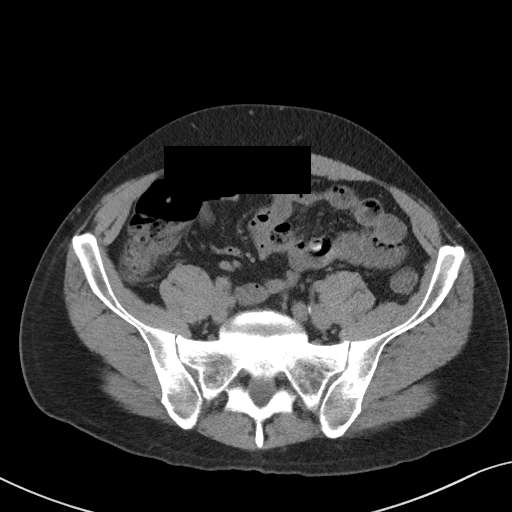
[im 46/91  soft-tissue]
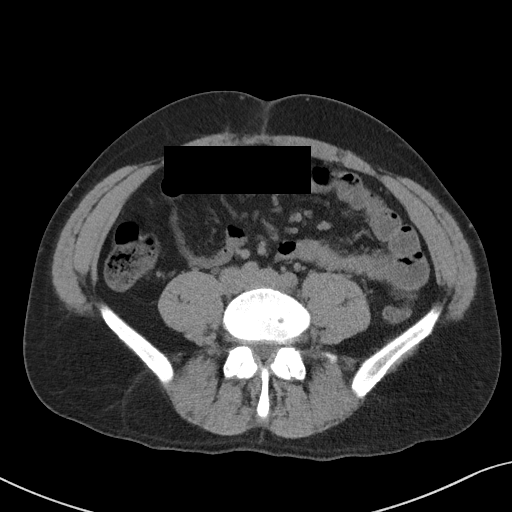
[im 54/91  soft-tissue]
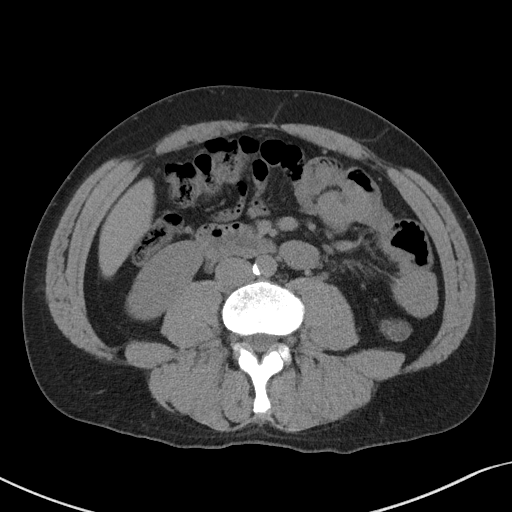
[im 58/91  soft-tissue]
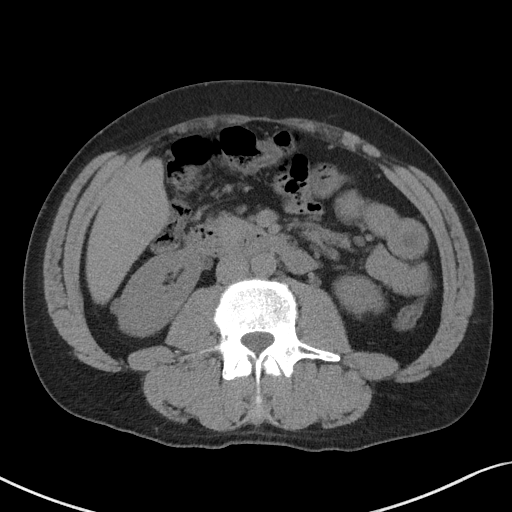
[im 58/91  bone]
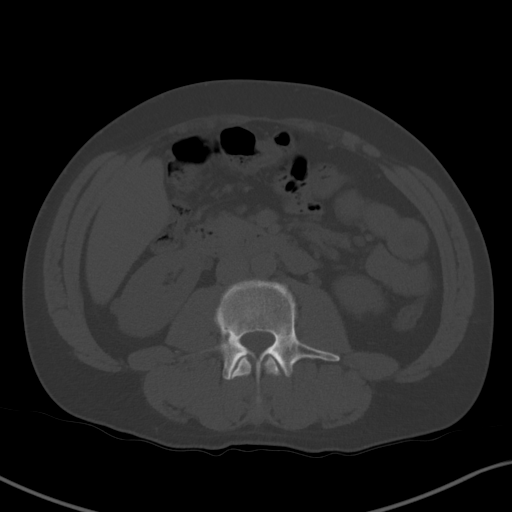
[im 66/91  soft-tissue]
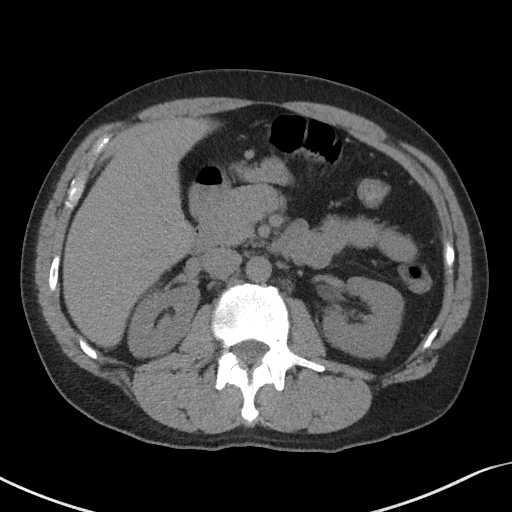
[im 70/91  soft-tissue]
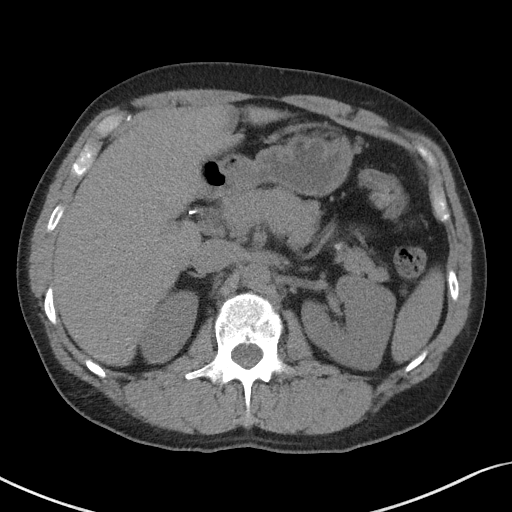
[im 78/91  soft-tissue]
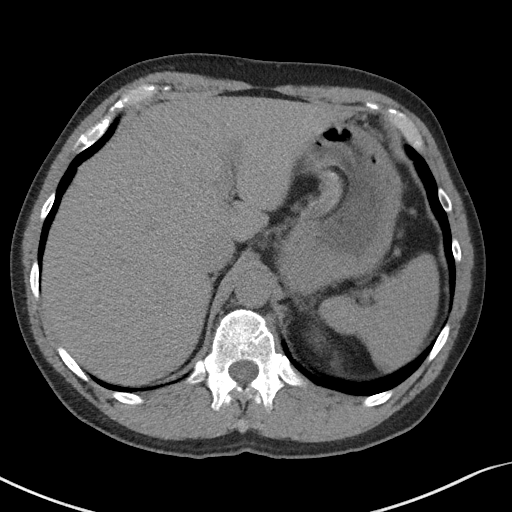
[im 86/91  soft-tissue]
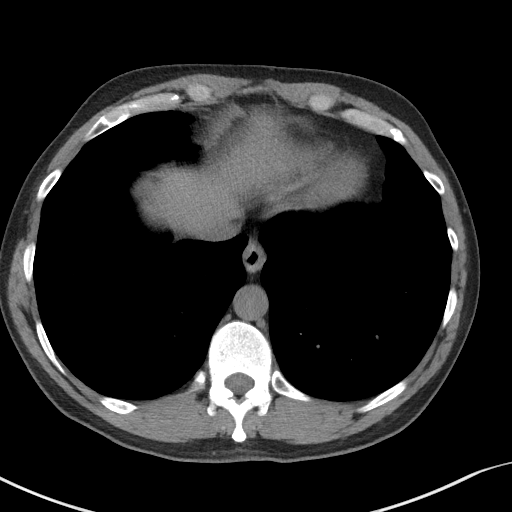

[Series 5: coronal st · coronal · 0.76mm/px · 3 of 101 slices shown]
[im 34/101  soft-tissue]
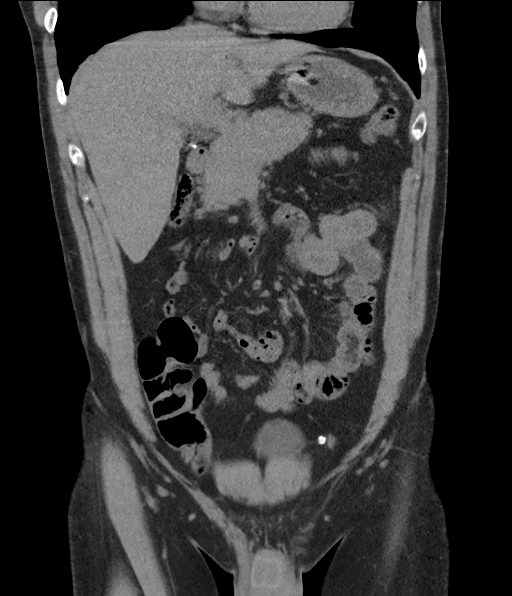
[im 45/101  soft-tissue]
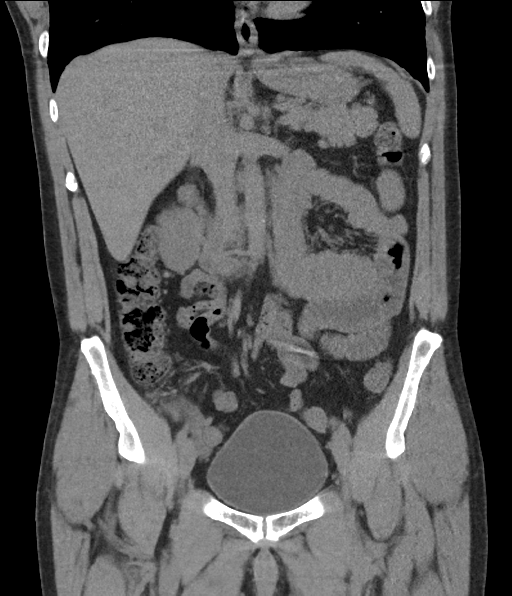
[im 56/101  soft-tissue]
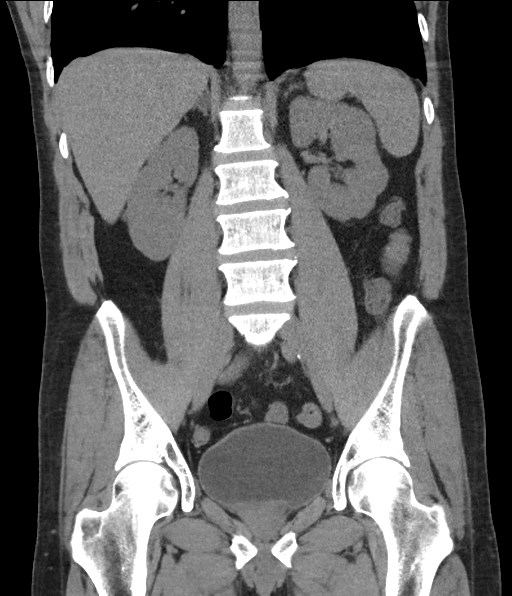

[16 of 46 positions shown; findings below may reference images not displayed]

FINDINGS: Lower chest: No acute pleural or parenchymal lung disease.

Hepatobiliary: Gallbladder is surgically absent. Focal fatty
infiltration along the falciform ligament. Otherwise unremarkable
unenhanced appearance of the liver.

Pancreas: Unremarkable unenhanced appearance.

Spleen: Unremarkable unenhanced appearance.

Adrenals/Urinary Tract: No urinary tract calculi or obstructive
uropathy within either kidney. 9 mm right renal cortical hypodensity
too small to characterize, statistically likely a small cyst. The
adrenals and bladder are unremarkable.

Stomach/Bowel: No bowel obstruction or ileus. Normal appendix right
lower quadrant. No bowel wall thickening or inflammatory change.

Vascular/Lymphatic: Aortic atherosclerosis. No enlarged abdominal or
pelvic lymph nodes.

Reproductive: Prostate is unremarkable.

Other: No free fluid or free intraperitoneal gas. No abdominal wall
hernia.

Musculoskeletal: No acute or destructive bony lesions. Reconstructed
images demonstrate no additional findings.
IMPRESSION: 1. No urinary tract calculi or obstructive uropathy.
2. No acute intra-abdominal or intrapelvic process identified on
this unenhanced exam.
3.  Aortic Atherosclerosis ([id number].D).

## 2022-06-29 IMAGING — CT CT ABD-PELV W/ CM
2 of 5 series · 16 of 46 positions shown, 18 images · IV contrast (Omnipaque or Isovue)
Comparison: CT April 03, 2021.

CLINICAL DATA: Hernia with worsening left groin pain concern for
strangulation

EXAM:
CT ABDOMEN AND PELVIS WITH CONTRAST
TECHNIQUE: Multidetector CT imaging of the abdomen and pelvis was performed
using the standard protocol following bolus administration of
intravenous contrast.

[Series 2: axial st · axial · 0.98mm/px · z∈[+1403,+1843]mm · 13 of 100 slices shown, 15 images]
[im 6/100  soft-tissue]
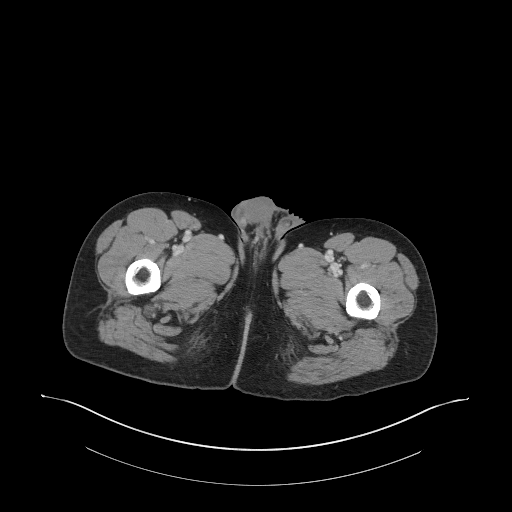
[im 6/100  bone]
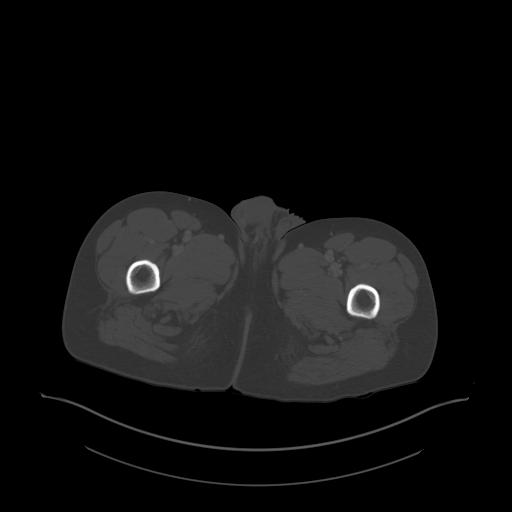
[im 12/100  soft-tissue]
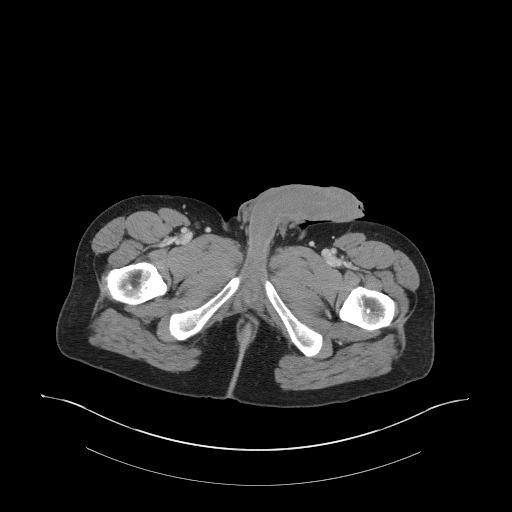
[im 24/100  soft-tissue]
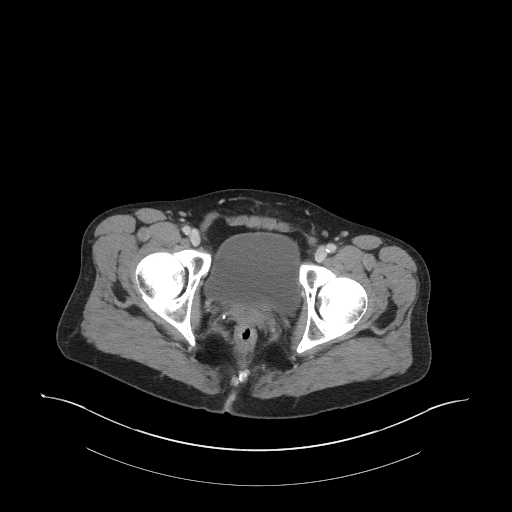
[im 30/100  soft-tissue]
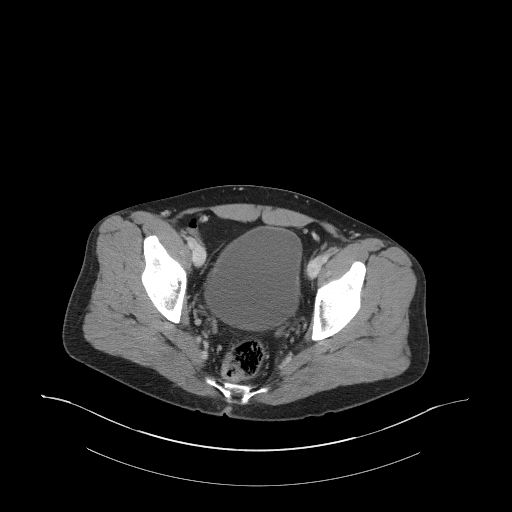
[im 35/100  soft-tissue]
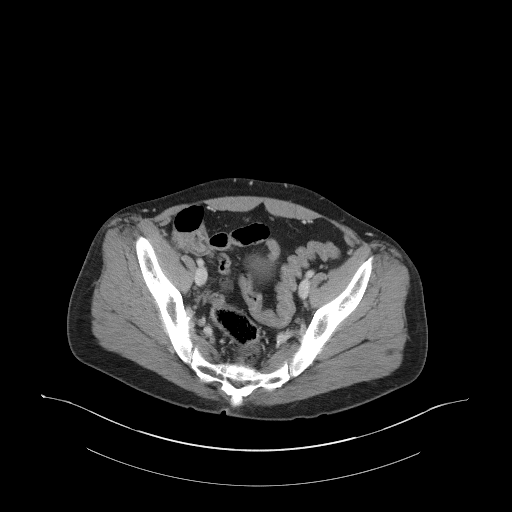
[im 41/100  soft-tissue]
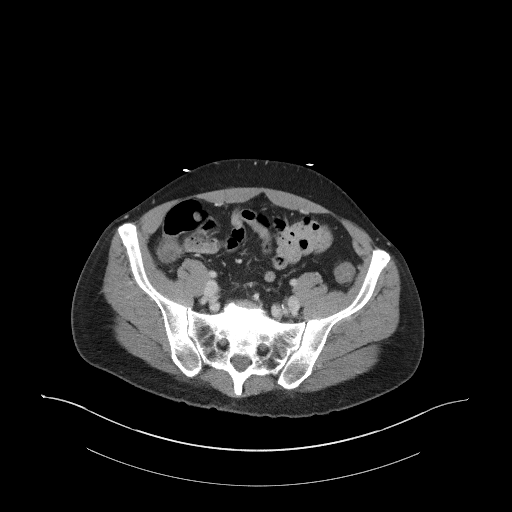
[im 53/100  soft-tissue]
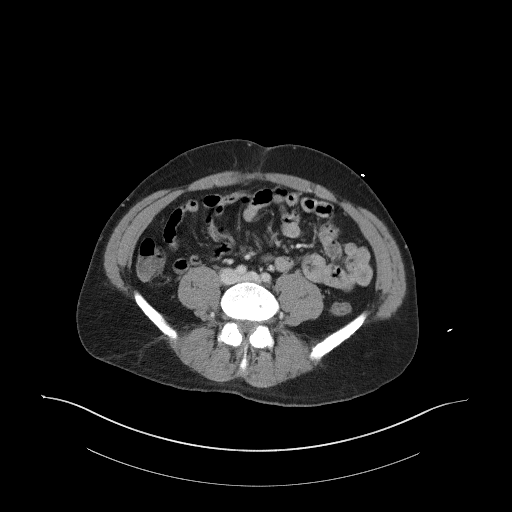
[im 59/100  soft-tissue]
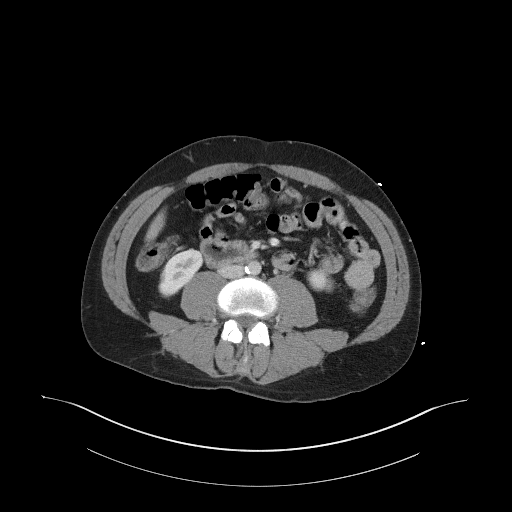
[im 65/100  soft-tissue]
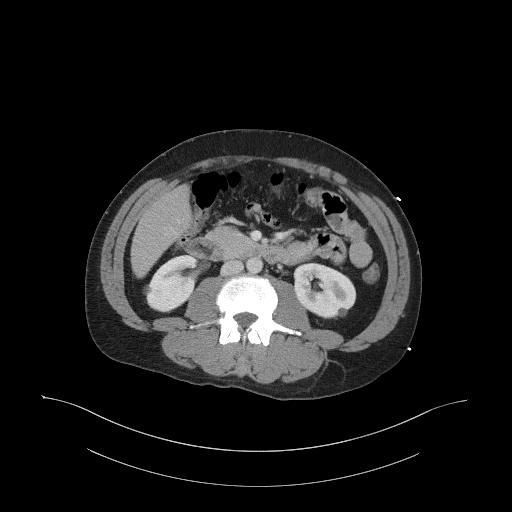
[im 65/100  bone]
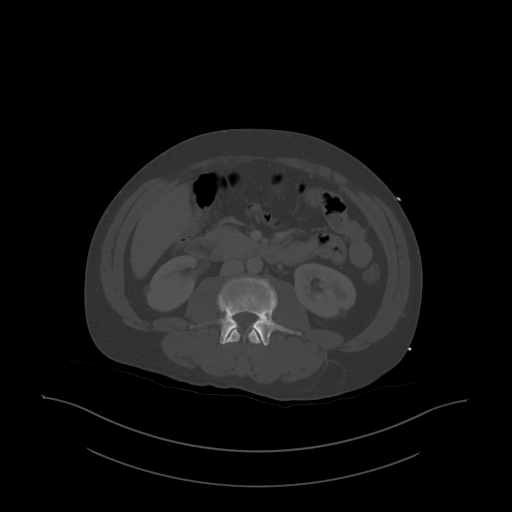
[im 70/100  soft-tissue]
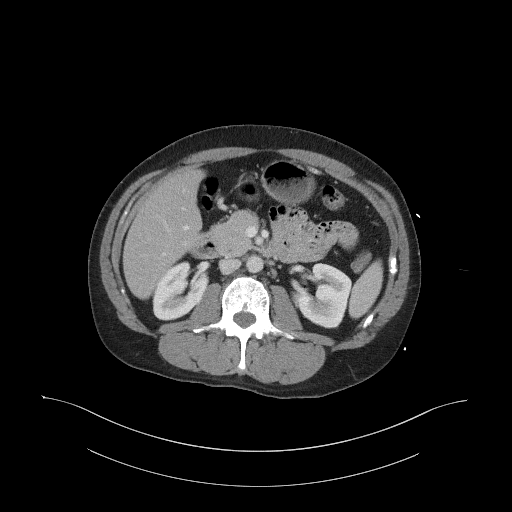
[im 76/100  soft-tissue]
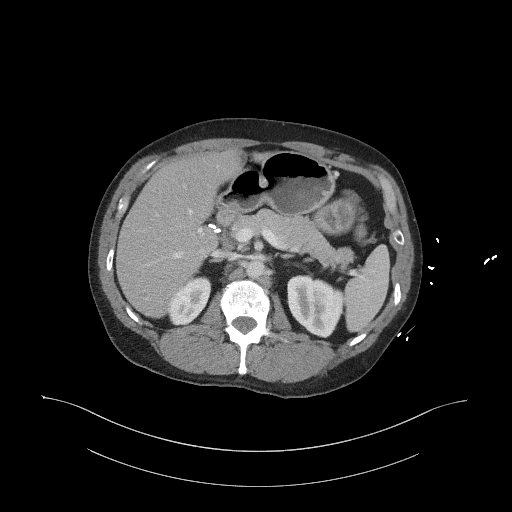
[im 88/100  soft-tissue]
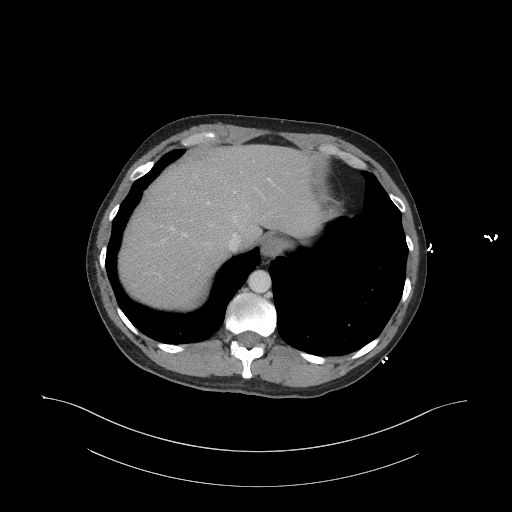
[im 94/100  soft-tissue]
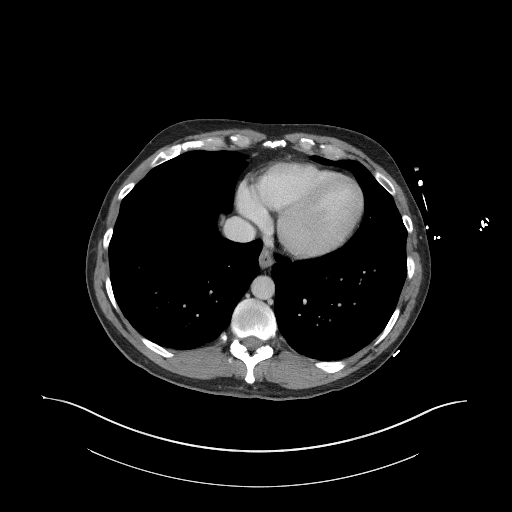

[Series 5: coronal st · coronal · 0.89mm/px · 3 of 117 slices shown]
[im 39/117  soft-tissue]
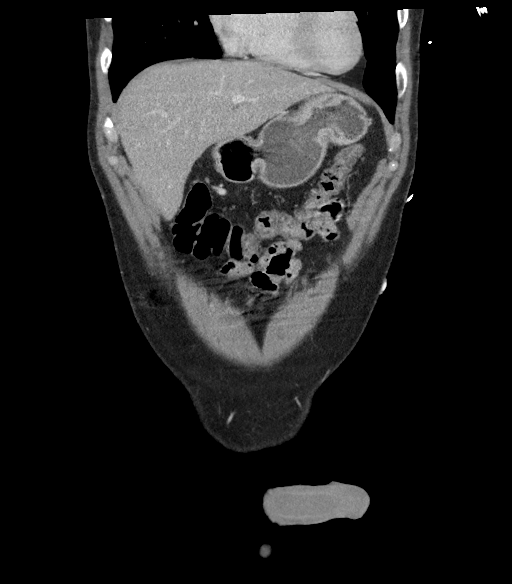
[im 52/117  soft-tissue]
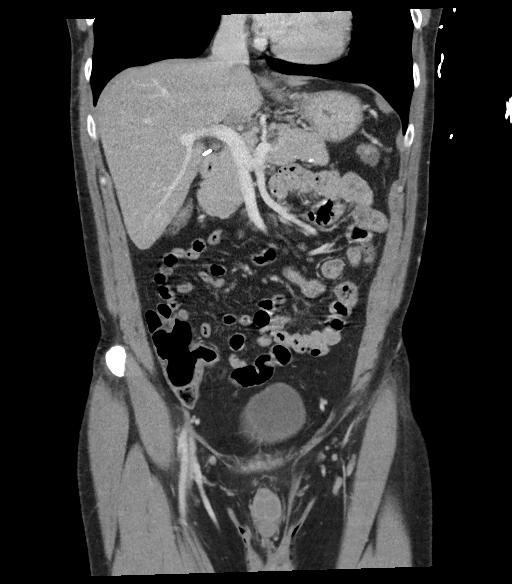
[im 65/117  soft-tissue]
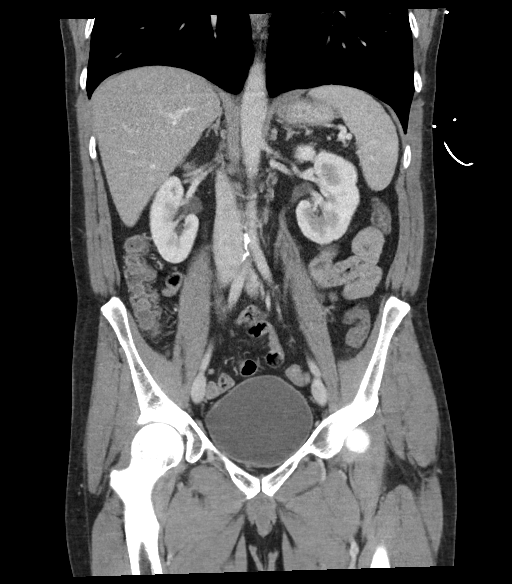

[16 of 46 positions shown; findings below may reference images not displayed]

RADIATION DOSE REDUCTION: This exam was performed according to the
departmental dose-optimization program which includes automated
exposure control, adjustment of the mA and/or kV according to
patient size and/or use of iterative reconstruction technique.

CONTRAST:  100mL OMNIPAQUE IOHEXOL 300 MG/ML  SOLN
FINDINGS: Lower chest: No acute abnormality.

Hepatobiliary: Nodular hypodensity along the anterior aspect of the
falciform ligament for instance on image [DATE] is in a classic
location for focal fatty deposition. Gallbladder surgically absent.
No biliary ductal dilation.

Pancreas: No pancreatic ductal dilation or evidence of acute
inflammation. Unchanged 6 mm dystrophic calcification along the
inferior aspect of the pancreatic tail.

Spleen: Within normal limits.

Adrenals/Urinary Tract: Bilateral adrenal glands appear normal. No
hydronephrosis. Bilateral subcentimeter hypodense renal lesions are
technically too small to accurately characterize but statistically
likely reflect cysts. Symmetric enhancement of the bilateral
kidneys. Urinary bladder is unremarkable for degree of distension.

Stomach/Bowel: No enteric contrast was administered. Stomach is
unremarkable for degree of distension. No pathologic dilation of
large or small bowel. The appendix and terminal ileum appear normal.
No evidence of acute bowel inflammation.

Vascular/Lymphatic: Scattered aortic atherosclerosis without
aneurysmal dilation. No pathologically enlarged abdominal or pelvic
lymph nodes.

Reproductive: Prostate is unremarkable.

Other: Tiny fat containing left inguinal hernia. No abdominopelvic
ascites.

Musculoskeletal: No acute or significant osseous findings.
IMPRESSION: No acute abdominopelvic findings.

Tiny fat containing left inguinal hernia.

Aortic Atherosclerosis (L1MEX-ZA0.0).

## 2022-10-11 DIAGNOSIS — Z0000 Encounter for general adult medical examination without abnormal findings: Secondary | ICD-10-CM | POA: Diagnosis not present

## 2022-10-11 DIAGNOSIS — I10 Essential (primary) hypertension: Secondary | ICD-10-CM | POA: Diagnosis not present

## 2022-10-16 DIAGNOSIS — Z0001 Encounter for general adult medical examination with abnormal findings: Secondary | ICD-10-CM | POA: Diagnosis not present

## 2022-10-16 DIAGNOSIS — S76012D Strain of muscle, fascia and tendon of left hip, subsequent encounter: Secondary | ICD-10-CM | POA: Diagnosis not present

## 2022-10-16 DIAGNOSIS — J45909 Unspecified asthma, uncomplicated: Secondary | ICD-10-CM | POA: Diagnosis not present

## 2022-10-16 DIAGNOSIS — I10 Essential (primary) hypertension: Secondary | ICD-10-CM | POA: Diagnosis not present

## 2022-10-16 DIAGNOSIS — E785 Hyperlipidemia, unspecified: Secondary | ICD-10-CM | POA: Diagnosis not present

## 2022-10-16 DIAGNOSIS — S76012A Strain of muscle, fascia and tendon of left hip, initial encounter: Secondary | ICD-10-CM | POA: Diagnosis not present

## 2022-10-21 ENCOUNTER — Encounter: Payer: Self-pay | Admitting: *Deleted

## 2022-11-03 ENCOUNTER — Telehealth: Payer: Self-pay | Admitting: Internal Medicine

## 2022-11-03 NOTE — Telephone Encounter (Signed)
Received pt's questionnaire and he will need an OV due to ETOH/pot

## 2022-11-09 NOTE — Progress Notes (Unsigned)
Referring Provider:Huffman, Francee Piccolo, NP Primary Care Physician:  Lupita Raider, NP Primary Gastroenterologist:  Dr. Jena Gauss  No chief complaint on file.   HPI:   Howard Bishop is a 47 y.o. male presenting today at the request of Lupita Raider, NP for colon cancer screening.   Reviewed colonoscopy questionnaire.  Patient's never had a colonoscopy.  Reported grandmother with history of colon cancer.  Past Medical History:  Diagnosis Date   ADHD (attention deficit hyperactivity disorder)    Anxiety    Bipolar disorder (HCC)    Chronic kidney disease    DDD (degenerative disc disease), cervical    Gallstone    Headache(784.0)    Hepatitis C antibody test positive    Hx of concussion    Hypertension    PTSD (post-traumatic stress disorder)    Schizoaffective disorder (HCC)    Seizures (HCC)     Past Surgical History:  Procedure Laterality Date   CHOLECYSTECTOMY     RENAL ARTERY STENT      Current Outpatient Medications  Medication Sig Dispense Refill   acetaminophen (TYLENOL) 500 MG tablet Take 500 mg by mouth every 6 (six) hours as needed.     albuterol (VENTOLIN HFA) 108 (90 Base) MCG/ACT inhaler Inhale 2 puffs into the lungs every 4 (four) hours as needed for wheezing or shortness of breath. 18 g 0   chlordiazePOXIDE (LIBRIUM) 25 MG capsule 50mg  PO TID x 1D, then 25-50mg  PO BID X 1D, then 25-50mg  PO QD X 1D 10 capsule 0   diclofenac Sodium (VOLTAREN) 1 % GEL Apply 2 g topically 4 (four) times daily. 100 g 0   doxycycline (VIBRAMYCIN) 100 MG capsule Take 1 capsule (100 mg total) by mouth 2 (two) times daily. 20 capsule 0   guaiFENesin (MUCINEX) 600 MG 12 hr tablet Take 1 tablet (600 mg total) by mouth 2 (two) times daily. 20 tablet 0   HYDROcodone-acetaminophen (NORCO/VICODIN) 5-325 MG tablet Take 1 tablet by mouth every 6 (six) hours as needed. 20 tablet 0   lidocaine (LIDODERM) 5 % Place 1 patch onto the skin daily. Remove & Discard patch within 12 hours or as  directed by MD 30 patch 0   lisinopril (ZESTRIL) 10 MG tablet lisinopril 10 mg tablet     methocarbamol (ROBAXIN) 500 MG tablet methocarbamol 500 mg tablet  TAKE 1 TABLET BY MOUTH TWICE DAILY FOR 10 DAYS     naproxen sodium (ALEVE) 220 MG tablet Take 440 mg by mouth daily as needed (for pain).     orphenadrine (NORFLEX) 100 MG tablet Take 1 tablet (100 mg total) by mouth 2 (two) times daily. 12 tablet 0   predniSONE (DELTASONE) 20 MG tablet prednisone 20 mg tablet  TAKE 2 TABLETS BY MOUTH ONCE DAILY FOR 3 DAYS     predniSONE (DELTASONE) 20 MG tablet Take 2 tablets (40 mg total) by mouth daily with breakfast. 10 tablet 0   promethazine-dextromethorphan (PROMETHAZINE-DM) 6.25-15 MG/5ML syrup Take 5 mLs by mouth 4 (four) times daily as needed. 100 mL 0   sulfamethoxazole-trimethoprim (BACTRIM DS) 800-160 MG tablet sulfamethoxazole 800 mg-trimethoprim 160 mg tablet  TAKE 1 TABLET BY MOUTH TWICE DAILY FOR 5 DAYS     No current facility-administered medications for this visit.    Allergies as of 11/10/2022 - Review Complete 03/09/2022  Allergen Reaction Noted   Aspirin Swelling and Other (See Comments) 01/24/2012   Penicillins Swelling and Other (See Comments) 04/30/2012   Tomato Swelling 01/24/2012   Ambien [zolpidem]  01/18/2014   Bee venom Other (See Comments) 03/09/2022   Ketorolac Other (See Comments) 03/09/2022   Paroxetine hcl Other (See Comments) 03/09/2022   Tramadol hcl Other (See Comments) 03/09/2022   Ultram [tramadol]  04/30/2012    Family History  Problem Relation Age of Onset   Drug abuse Mother    Alcohol abuse Father    Anxiety disorder Sister    Drug abuse Sister     Social History   Socioeconomic History   Marital status: Married    Spouse name: Not on file   Number of children: 4   Years of education: GED   Highest education level: Not on file  Occupational History   Occupation: Disabled  Tobacco Use   Smoking status: Every Day    Current packs/day: 1.00     Average packs/day: 1 pack/day for 32.6 years (32.6 ttl pk-yrs)    Types: Cigarettes    Start date: 03/29/1990   Smokeless tobacco: Former    Types: Engineer, drilling   Vaping status: Never Used  Substance and Sexual Activity   Alcohol use: Not Currently    Alcohol/week: 0.0 standard drinks of alcohol    Comment: not since 2018   Drug use: Yes    Frequency: 7.0 times per week    Types: Marijuana    Comment: 3 grams per day   Sexual activity: Yes    Partners: Male    Comment: about 2x/week, wife not satisfied at this low rate  Other Topics Concern   Not on file  Social History Narrative   Lives at home with wife, child and inlaws.   Right-handed.   1/2 gallon of sweet tea per day.   Social Determinants of Health   Financial Resource Strain: Not on file  Food Insecurity: Not on file  Transportation Needs: Not on file  Physical Activity: Not on file  Stress: Not on file  Social Connections: Not on file  Intimate Partner Violence: Not on file    Review of Systems: Gen: Denies any fever, chills, fatigue, weight loss, lack of appetite.  CV: Denies chest pain, heart palpitations, peripheral edema, syncope.  Resp: Denies shortness of breath at rest or with exertion. Denies wheezing or cough.  GI: Denies dysphagia or odynophagia. Denies jaundice, hematemesis, fecal incontinence. GU : Denies urinary burning, urinary frequency, urinary hesitancy MS: Denies joint pain, muscle weakness, cramps, or limitation of movement.  Derm: Denies rash, itching, dry skin Psych: Denies depression, anxiety, memory loss, and confusion Heme: Denies bruising, bleeding, and enlarged lymph nodes.  Physical Exam: There were no vitals taken for this visit. General:   Alert and oriented. Pleasant and cooperative. Well-nourished and well-developed.  Head:  Normocephalic and atraumatic. Eyes:  Without icterus, sclera clear and conjunctiva pink.  Ears:  Normal auditory acuity. Lungs:  Clear to  auscultation bilaterally. No wheezes, rales, or rhonchi. No distress.  Heart:  S1, S2 present without murmurs appreciated.  Abdomen:  +BS, soft, non-tender and non-distended. No HSM noted. No guarding or rebound. No masses appreciated.  Rectal:  Deferred  Msk:  Symmetrical without gross deformities. Normal posture. Extremities:  Without edema. Neurologic:  Alert and  oriented x4;  grossly normal neurologically. Skin:  Intact without significant lesions or rashes. Psych:  Alert and cooperative. Normal mood and affect.    Assessment:     Plan:  ***   Ermalinda Memos, PA-C [facility] 11/10/2022

## 2022-11-10 ENCOUNTER — Encounter: Payer: Self-pay | Admitting: Gastroenterology

## 2022-11-10 ENCOUNTER — Ambulatory Visit: Payer: BC Managed Care – PPO | Admitting: Gastroenterology

## 2022-11-10 VITALS — BP 142/86 | HR 80 | Temp 97.9°F | Ht 74.0 in | Wt 187.2 lb

## 2022-11-10 DIAGNOSIS — Z1211 Encounter for screening for malignant neoplasm of colon: Secondary | ICD-10-CM

## 2022-11-10 NOTE — Patient Instructions (Addendum)
We will schedule you for colonoscopy with Dr. Tasia Catchings in the near future at [facility].  We will follow-up with you in the office as needed.  It was very nice to meet you today!   Ermalinda Memos, PA-C [facility]

## 2022-11-11 ENCOUNTER — Other Ambulatory Visit: Payer: Self-pay | Admitting: *Deleted

## 2022-11-11 ENCOUNTER — Encounter: Payer: Self-pay | Admitting: *Deleted

## 2022-11-11 MED ORDER — PEG 3350-KCL-NA BICARB-NACL 420 G PO SOLR: 4000.0000 mL | Freq: Once | ORAL | 0 refills | Status: AC

## 2022-11-15 ENCOUNTER — Telehealth: Payer: Self-pay | Admitting: *Deleted

## 2022-11-15 NOTE — Telephone Encounter (Signed)
Pt's wife left vm stating that 12/23/22 wasn't a good date for his colonoscopy. Would like to get it scheduled 9/17-9/20.  LMTRC

## 2022-11-16 ENCOUNTER — Encounter (INDEPENDENT_AMBULATORY_CARE_PROVIDER_SITE_OTHER): Payer: Self-pay

## 2022-11-16 NOTE — Telephone Encounter (Signed)
Pt has been rescheduled for 12/16/22. Updated instructions sent to pt via MyChart.

## 2022-12-13 NOTE — Patient Instructions (Addendum)
Howard Bishop  12/13/2022     @PREFPERIOPPHARMACY @   Your procedure is scheduled on  12/16/2022.   Report to [facility] at   0600 A.M.   Call this number if you have problems the morning of surgery:  [phone]  If you experience any cold or flu symptoms such as cough, fever, chills, shortness of breath, etc. between now and your scheduled surgery, please notify us at the above number.   Remember:  Follow the diet and prep instructions given to you by the office.     Use your inhalers before you come and bring your rescue inhaler with you.     Take these medicines the morning of surgery with A SIP OF WATER                                         None    Do not wear jewelry, make-up or nail polish, including gel polish,  artificial nails, or any other type of covering on natural nails (fingers and  toes).  Do not wear lotions, powders, or perfumes, or deodorant.  Do not shave 48 hours prior to surgery.  Men may shave face and neck.  Do not bring valuables to the hospital.  [facility] is not responsible for any belongings or valuables.  Contacts, dentures or bridgework may not be worn into surgery.  Leave your suitcase in the car.  After surgery it may be brought to your room.  For patients admitted to the hospital, discharge time will be determined by your treatment team.  Patients discharged the day of surgery will not be allowed to drive home and must have someone with them for 24 hours.     Special instructions:   DO NOT smoke tobacco or vape for 24 hours before your procedure.  Please read over the following fact sheets that you were given. Anesthesia Post-op Instructions and Care and Recovery After Surgery         Colonoscopy, Adult, Care After The following information offers guidance on how to care for yourself after your procedure. Your health care provider may also give you more specific instructions. If you have problems or questions,  contact your health care provider. What can I expect after the procedure? After the procedure, it is common to have: A small amount of blood in your stool for 24 hours after the procedure. Some gas. Mild cramping or bloating of your abdomen. Follow these instructions at home: Eating and drinking  Drink enough fluid to keep your urine pale yellow. Follow instructions from your health care provider about eating or drinking restrictions. Resume your normal diet as told by your health care provider. Avoid heavy or fried foods that are hard to digest. Activity Rest as told by your health care provider. Avoid sitting for a long time without moving. Get up to take short walks every 1-2 hours. This is important to improve blood flow and breathing. Ask for help if you feel weak or unsteady. Return to your normal activities as told by your health care provider. Ask your health care provider what activities are safe for you. Managing cramping and bloating  Try walking around when you have cramps or feel bloated. If directed, apply heat to your abdomen as told by your health care provider. Use the heat source that your health care provider recommends, such  as a moist heat pack or a heating pad. Place a towel between your skin and the heat source. Leave the heat on for 20-30 minutes. Remove the heat if your skin turns bright red. This is especially important if you are unable to feel pain, heat, or cold. You have a greater risk of getting burned. General instructions If you were given a sedative during the procedure, it can affect you for several hours. Do not drive or operate machinery until your health care provider says that it is safe. For the first 24 hours after the procedure: Do not sign important documents. Do not drink alcohol. Do your regular daily activities at a slower pace than normal. Eat soft foods that are easy to digest. Take over-the-counter and prescription medicines only as told  by your health care provider. Keep all follow-up visits. This is important. Contact a health care provider if: You have blood in your stool 2-3 days after the procedure. Get help right away if: You have more than a small spotting of blood in your stool. You have large blood clots in your stool. You have swelling of your abdomen. You have nausea or vomiting. You have a fever. You have increasing pain in your abdomen that is not relieved with medicine. These symptoms may be an emergency. Get help right away. Call 911. Do not wait to see if the symptoms will go away. Do not drive yourself to the hospital. Summary After the procedure, it is common to have a small amount of blood in your stool. You may also have mild cramping and bloating of your abdomen. If you were given a sedative during the procedure, it can affect you for several hours. Do not drive or operate machinery until your health care provider says that it is safe. Get help right away if you have a lot of blood in your stool, nausea or vomiting, a fever, or increased pain in your abdomen. This information is not intended to replace advice given to you by your health care provider. Make sure you discuss any questions you have with your health care provider. Document Revised: 04/27/2022 Document Reviewed: 11/05/2020 Elsevier Patient Education  2024 [employer] Monitored Anesthesia Care, Care After The following information offers guidance on how to care for yourself after your procedure. Your health care provider may also give you more specific instructions. If you have problems or questions, contact your health care provider. What can I expect after the procedure? After the procedure, it is common to have: Tiredness. Little or no memory about what happened during or after the procedure. Impaired judgment when it comes to making decisions. Nausea or vomiting. Some trouble with balance. Follow these instructions at home: For  the time period you were told by your health care provider:  Rest. Do not participate in activities where you could fall or become injured. Do not drive or use machinery. Do not drink alcohol. Do not take sleeping pills or medicines that cause drowsiness. Do not make important decisions or sign legal documents. Do not take care of children on your own. Medicines Take over-the-counter and prescription medicines only as told by your health care provider. If you were prescribed antibiotics, take them as told by your health care provider. Do not stop using the antibiotic even if you start to feel better. Eating and drinking Follow instructions from your health care provider about what you may eat and drink. Drink enough fluid to keep your urine pale yellow. If you vomit: Drink  clear fluids slowly and in small amounts as you are able. Clear fluids include water, ice chips, low-calorie sports drinks, and fruit juice that has water added to it (diluted fruit juice). Eat light and bland foods in small amounts as you are able. These foods include bananas, applesauce, rice, lean meats, toast, and crackers. General instructions  Have a responsible adult stay with you for the time you are told. It is important to have someone help care for you until you are awake and alert. If you have sleep apnea, surgery and some medicines can increase your risk for breathing problems. Follow instructions from your health care provider about wearing your sleep device: When you are sleeping. This includes during daytime naps. While taking prescription pain medicines, sleeping medicines, or medicines that make you drowsy. Do not use any products that contain nicotine or tobacco. These products include cigarettes, chewing tobacco, and vaping devices, such as e-cigarettes. If you need help quitting, ask your health care provider. Contact a health care provider if: You feel nauseous or vomit every time you eat or  drink. You feel light-headed. You are still sleepy or having trouble with balance after 24 hours. You get a rash. You have a fever. You have redness or swelling around the IV site. Get help right away if: You have trouble breathing. You have new confusion after you get home. These symptoms may be an emergency. Get help right away. Call 911. Do not wait to see if the symptoms will go away. Do not drive yourself to the hospital. This information is not intended to replace advice given to you by your health care provider. Make sure you discuss any questions you have with your health care provider. Document Revised: 08/10/2021 Document Reviewed: 08/10/2021 Elsevier Patient Education  2024 [employer].

## 2022-12-14 ENCOUNTER — Other Ambulatory Visit: Payer: Self-pay

## 2022-12-14 ENCOUNTER — Encounter (HOSPITAL_COMMUNITY): Payer: Self-pay

## 2022-12-14 ENCOUNTER — Encounter (HOSPITAL_COMMUNITY)
Admission: RE | Admit: 2022-12-14 | Discharge: 2022-12-14 | Disposition: A | Discharge status: Home / Self Care | Payer: BC Managed Care – PPO | Source: Ambulatory Visit | Attending: Gastroenterology | Admitting: Gastroenterology

## 2022-12-14 VITALS — BP 142/85 | HR 80 | Temp 97.8°F | Resp 18 | Ht 74.0 in | Wt 187.2 lb

## 2022-12-14 DIAGNOSIS — K644 Residual hemorrhoidal skin tags: Secondary | ICD-10-CM | POA: Diagnosis not present

## 2022-12-14 DIAGNOSIS — F17210 Nicotine dependence, cigarettes, uncomplicated: Secondary | ICD-10-CM | POA: Diagnosis not present

## 2022-12-14 DIAGNOSIS — F419 Anxiety disorder, unspecified: Secondary | ICD-10-CM | POA: Diagnosis not present

## 2022-12-14 DIAGNOSIS — R0602 Shortness of breath: Secondary | ICD-10-CM | POA: Diagnosis not present

## 2022-12-14 DIAGNOSIS — R519 Headache, unspecified: Secondary | ICD-10-CM | POA: Diagnosis not present

## 2022-12-14 DIAGNOSIS — Z01810 Encounter for preprocedural cardiovascular examination: Secondary | ICD-10-CM | POA: Insufficient documentation

## 2022-12-14 DIAGNOSIS — K5730 Diverticulosis of large intestine without perforation or abscess without bleeding: Secondary | ICD-10-CM | POA: Diagnosis not present

## 2022-12-14 DIAGNOSIS — Z1211 Encounter for screening for malignant neoplasm of colon: Secondary | ICD-10-CM | POA: Diagnosis not present

## 2022-12-14 DIAGNOSIS — D123 Benign neoplasm of transverse colon: Secondary | ICD-10-CM | POA: Diagnosis not present

## 2022-12-14 DIAGNOSIS — Z800 Family history of malignant neoplasm of digestive organs: Secondary | ICD-10-CM | POA: Diagnosis not present

## 2022-12-14 DIAGNOSIS — F319 Bipolar disorder, unspecified: Secondary | ICD-10-CM | POA: Diagnosis not present

## 2022-12-14 DIAGNOSIS — I129 Hypertensive chronic kidney disease with stage 1 through stage 4 chronic kidney disease, or unspecified chronic kidney disease: Secondary | ICD-10-CM | POA: Diagnosis not present

## 2022-12-14 DIAGNOSIS — I10 Essential (primary) hypertension: Secondary | ICD-10-CM | POA: Insufficient documentation

## 2022-12-14 DIAGNOSIS — N189 Chronic kidney disease, unspecified: Secondary | ICD-10-CM | POA: Diagnosis not present

## 2022-12-14 DIAGNOSIS — J449 Chronic obstructive pulmonary disease, unspecified: Secondary | ICD-10-CM | POA: Diagnosis not present

## 2022-12-14 DIAGNOSIS — K648 Other hemorrhoids: Secondary | ICD-10-CM | POA: Diagnosis not present

## 2022-12-14 DIAGNOSIS — I4510 Unspecified right bundle-branch block: Secondary | ICD-10-CM | POA: Diagnosis not present

## 2022-12-14 HISTORY — DX: Dyspnea, unspecified: R06.00

## 2022-12-16 ENCOUNTER — Encounter (INDEPENDENT_AMBULATORY_CARE_PROVIDER_SITE_OTHER): Payer: Self-pay | Admitting: *Deleted

## 2022-12-16 ENCOUNTER — Ambulatory Visit (HOSPITAL_COMMUNITY)
Admission: RE | Admit: 2022-12-16 | Discharge: 2022-12-16 | Disposition: A | Discharge status: Home / Self Care | Payer: BC Managed Care – PPO | Source: Ambulatory Visit | Attending: Gastroenterology | Admitting: Gastroenterology

## 2022-12-16 ENCOUNTER — Encounter (HOSPITAL_COMMUNITY): Payer: Self-pay

## 2022-12-16 ENCOUNTER — Ambulatory Visit (HOSPITAL_COMMUNITY): Payer: BC Managed Care – PPO | Admitting: Certified Registered"

## 2022-12-16 ENCOUNTER — Encounter (HOSPITAL_COMMUNITY)
Admission: RE | Disposition: A | Discharge status: Home / Self Care | Payer: Self-pay | Source: Ambulatory Visit | Attending: Gastroenterology

## 2022-12-16 DIAGNOSIS — F17210 Nicotine dependence, cigarettes, uncomplicated: Secondary | ICD-10-CM | POA: Insufficient documentation

## 2022-12-16 DIAGNOSIS — F419 Anxiety disorder, unspecified: Secondary | ICD-10-CM | POA: Insufficient documentation

## 2022-12-16 DIAGNOSIS — F319 Bipolar disorder, unspecified: Secondary | ICD-10-CM | POA: Insufficient documentation

## 2022-12-16 DIAGNOSIS — J449 Chronic obstructive pulmonary disease, unspecified: Secondary | ICD-10-CM | POA: Diagnosis not present

## 2022-12-16 DIAGNOSIS — K635 Polyp of colon: Secondary | ICD-10-CM | POA: Diagnosis not present

## 2022-12-16 DIAGNOSIS — I129 Hypertensive chronic kidney disease with stage 1 through stage 4 chronic kidney disease, or unspecified chronic kidney disease: Secondary | ICD-10-CM | POA: Insufficient documentation

## 2022-12-16 DIAGNOSIS — R0602 Shortness of breath: Secondary | ICD-10-CM | POA: Diagnosis not present

## 2022-12-16 DIAGNOSIS — K644 Residual hemorrhoidal skin tags: Secondary | ICD-10-CM | POA: Insufficient documentation

## 2022-12-16 DIAGNOSIS — K5730 Diverticulosis of large intestine without perforation or abscess without bleeding: Secondary | ICD-10-CM | POA: Diagnosis not present

## 2022-12-16 DIAGNOSIS — D123 Benign neoplasm of transverse colon: Secondary | ICD-10-CM

## 2022-12-16 DIAGNOSIS — Z1211 Encounter for screening for malignant neoplasm of colon: Secondary | ICD-10-CM | POA: Diagnosis not present

## 2022-12-16 DIAGNOSIS — R519 Headache, unspecified: Secondary | ICD-10-CM | POA: Diagnosis not present

## 2022-12-16 DIAGNOSIS — K648 Other hemorrhoids: Secondary | ICD-10-CM | POA: Diagnosis not present

## 2022-12-16 DIAGNOSIS — I4510 Unspecified right bundle-branch block: Secondary | ICD-10-CM | POA: Diagnosis not present

## 2022-12-16 DIAGNOSIS — Z800 Family history of malignant neoplasm of digestive organs: Secondary | ICD-10-CM | POA: Insufficient documentation

## 2022-12-16 DIAGNOSIS — N189 Chronic kidney disease, unspecified: Secondary | ICD-10-CM | POA: Diagnosis not present

## 2022-12-16 HISTORY — PX: COLONOSCOPY WITH PROPOFOL: SHX5780

## 2022-12-16 HISTORY — PX: POLYPECTOMY: SHX5525

## 2022-12-16 LAB — HM COLONOSCOPY

## 2022-12-16 SURGERY — COLONOSCOPY WITH PROPOFOL
Anesthesia: General

## 2022-12-16 MED ORDER — PROPOFOL 10 MG/ML IV BOLUS
INTRAVENOUS | Status: DC | PRN
  Administered 2022-12-16: 50 mg via INTRAVENOUS
  Administered 2022-12-16: 100 mg via INTRAVENOUS
  Administered 2022-12-16: 50 mg via INTRAVENOUS

## 2022-12-16 MED ORDER — MIDAZOLAM HCL 2 MG/2ML IJ SOLN
INTRAMUSCULAR | Status: DC | PRN
Start: 2022-12-16 — End: 2022-12-16
  Administered 2022-12-16: 2 mg via INTRAVENOUS

## 2022-12-16 MED ORDER — LIDOCAINE HCL (CARDIAC) PF 100 MG/5ML IV SOSY
PREFILLED_SYRINGE | INTRAVENOUS | Status: DC | PRN
  Administered 2022-12-16: 50 mg via INTRAVENOUS

## 2022-12-16 MED ORDER — DEXMEDETOMIDINE HCL IN NACL 80 MCG/20ML IV SOLN
INTRAVENOUS | Status: AC
  Filled 2022-12-16: qty 20

## 2022-12-16 MED ORDER — PROPOFOL 500 MG/50ML IV EMUL
INTRAVENOUS | Status: DC | PRN
  Administered 2022-12-16: 250 ug/kg/min via INTRAVENOUS

## 2022-12-16 MED ORDER — DEXMEDETOMIDINE HCL IN NACL 80 MCG/20ML IV SOLN
INTRAVENOUS | Status: DC | PRN
Start: 2022-12-16 — End: 2022-12-16
  Administered 2022-12-16: 8 ug via INTRAVENOUS
  Administered 2022-12-16: 12 ug via INTRAVENOUS

## 2022-12-16 MED ORDER — MIDAZOLAM HCL 2 MG/2ML IJ SOLN
INTRAMUSCULAR | Status: AC
  Filled 2022-12-16: qty 2

## 2022-12-16 MED ORDER — LACTATED RINGERS IV SOLN: INTRAVENOUS | Status: DC | PRN

## 2022-12-16 NOTE — Discharge Instructions (Signed)

## 2022-12-16 NOTE — Anesthesia Preprocedure Evaluation (Signed)
Anesthesia Evaluation  Patient identified by MRN, date of birth, ID band Patient awake    Reviewed: Allergy & Precautions, H&P , NPO status , Patient's Chart, lab work & pertinent test results, reviewed documented beta blocker date and time   Airway Mallampati: II  TM Distance: >3 FB Neck ROM: full    Dental no notable dental hx.    Pulmonary neg pulmonary ROS, shortness of breath, COPD, Current Smoker   Pulmonary exam normal breath sounds clear to auscultation       Cardiovascular Exercise Tolerance: Good hypertension, negative cardio ROS  Rhythm:regular Rate:Normal     Neuro/Psych  Headaches, Seizures -,  PSYCHIATRIC DISORDERS Anxiety  Bipolar Disorder Schizophrenia Dementia negative neurological ROS  negative psych ROS   GI/Hepatic negative GI ROS, Neg liver ROS,,,  Endo/Other  negative endocrine ROS    Renal/GU Renal diseasenegative Renal ROS  negative genitourinary   Musculoskeletal   Abdominal   Peds  Hematology negative hematology ROS (+)   Anesthesia Other Findings   Reproductive/Obstetrics negative OB ROS                             Anesthesia Physical Anesthesia Plan  ASA: 3  Anesthesia Plan: General   Post-op Pain Management:    Induction:   PONV Risk Score and Plan: Propofol infusion  Airway Management Planned:   Additional Equipment:   Intra-op Plan:   Post-operative Plan:   Informed Consent: I have reviewed the patients History and Physical, chart, labs and discussed the procedure including the risks, benefits and alternatives for the proposed anesthesia with the patient or authorized representative who has indicated his/her understanding and acceptance.     Dental Advisory Given  Plan Discussed with: CRNA  Anesthesia Plan Comments:        Anesthesia Quick Evaluation

## 2022-12-16 NOTE — H&P (Signed)
Primary Care Physician:  Lupita Raider, NP Primary Gastroenterologist:  Dr. Tasia Catchings  Pre-Procedure History & Physical: HPI:  Howard Bishop is a 47 y.o. male is here for a colonoscopy for colon cancer screening purposes.   Paternal grandmother with history of colon cancer in her 79s.   No melena or hematochezia.  No abdominal pain or unintentional weight loss.  No change in bowel habits.  Overall feels well from a GI standpoint.  Past Medical History:  Diagnosis Date   ADHD (attention deficit hyperactivity disorder)    Anxiety    Bipolar disorder (HCC)    Chronic kidney disease    COPD (chronic obstructive pulmonary disease) (HCC)    DDD (degenerative disc disease), cervical    Dyspnea    Gallstone    Headache(784.0)    Hepatitis C antibody test positive    Hx of concussion    Hypertension    PTSD (post-traumatic stress disorder)    Schizoaffective disorder (HCC)    Seizures (HCC)     Past Surgical History:  Procedure Laterality Date   CHOLECYSTECTOMY     RENAL ARTERY STENT     VASECTOMY  12/2021    Prior to Admission medications   Medication Sig Start Date End Date Taking? Authorizing Provider  acetaminophen (TYLENOL) 500 MG tablet Take 500 mg by mouth every 6 (six) hours as needed.   Yes [provider]  albuterol (VENTOLIN HFA) 108 (90 Base) MCG/ACT inhaler Inhale 2 puffs into the lungs every 4 (four) hours as needed for wheezing or shortness of breath. 03/09/22  Yes Particia Nearing, PA-C  losartan (COZAAR) 25 MG tablet Take 1 tablet by mouth daily.   Yes [provider]  naproxen sodium (ALEVE) 220 MG tablet Take 440 mg by mouth daily as needed (for pain).   Yes [provider]  TRELEGY ELLIPTA 100-62.5-25 MCG/ACT AEPB Inhale 1 puff into the lungs daily. 10/16/22  Yes [provider]    Allergies as of 11/11/2022 - Review Complete 11/10/2022  Allergen Reaction Noted   Aspirin Swelling and Other (See Comments) 01/24/2012    Penicillins Swelling and Other (See Comments) 04/30/2012   Tomato Swelling 01/24/2012   Ambien [zolpidem]  01/18/2014   Bee venom Other (See Comments) 03/09/2022   Ketorolac Other (See Comments) 03/09/2022   Paroxetine hcl Other (See Comments) 03/09/2022   Tramadol hcl Other (See Comments) 03/09/2022   Ultram [tramadol]  04/30/2012    Family History  Problem Relation Age of Onset   Drug abuse Mother    Alcohol abuse Father    Anxiety disorder Sister    Drug abuse Sister    Colon cancer Paternal Grandmother        39s    Social History   Socioeconomic History   Marital status: Married    Spouse name: Not on file   Number of children: 4   Years of education: GED   Highest education level: Not on file  Occupational History   Occupation: Disabled  Tobacco Use   Smoking status: Every Day    Current packs/day: 1.00    Average packs/day: 1 pack/day for 32.7 years (32.7 ttl pk-yrs)    Types: Cigarettes    Start date: 03/29/1990   Smokeless tobacco: Former    Types: Designer, multimedia Use   Vaping status: Never Used  Substance and Sexual Activity   Alcohol use: Yes    Comment: 24 oz beer a day   Drug use: Yes  Frequency: 7.0 times per week    Types: Marijuana    Comment: 3 grams per day   Sexual activity: Yes    Partners: Male    Comment: about 2x/week, wife not satisfied at this low rate  Other Topics Concern   Not on file  Social History Narrative   Lives at home with wife, child and inlaws.   Right-handed.   1/2 gallon of sweet tea per day.   Social Determinants of Health   Financial Resource Strain: Not on file  Food Insecurity: Not on file  Transportation Needs: Not on file  Physical Activity: Not on file  Stress: Not on file  Social Connections: Not on file  Intimate Partner Violence: Not on file    Review of Systems: See HPI, otherwise negative ROS  Physical Exam: Vital signs in last 24 hours: Temp:  [97.7 F (36.5 C)] 97.7 F (36.5 C) (09/19  0630) Pulse Rate:  [75] 75 (09/19 0630) Resp:  [16] 16 (09/19 0630) BP: (141)/(83) 141/83 (09/19 0630) SpO2:  [98 %] 98 % (09/19 0630) Weight:  [85.7 kg] 85.7 kg (09/19 0630)   General:   Alert,  Well-developed, well-nourished, pleasant and cooperative in NAD Head:  Normocephalic and atraumatic. Eyes:  Sclera clear, no icterus.   Conjunctiva pink. Ears:  Normal auditory acuity. Nose:  No deformity, discharge,  or lesions. Msk:  Symmetrical without gross deformities. Normal posture. Extremities:  Without clubbing or edema. Neurologic:  Alert and  oriented x4;  grossly normal neurologically. Skin:  Intact without significant lesions or rashes. Psych:  Alert and cooperative. Normal mood and affect.  Impression/Plan: Howard Bishop is here for a colonoscopy to be performed for colon cancer screening purposes.  The risks of the procedure including infection, bleed, or perforation as well as benefits, limitations, alternatives and imponderables have been reviewed with the patient. Questions have been answered. All parties agreeable.

## 2022-12-16 NOTE — Anesthesia Procedure Notes (Addendum)
Date/Time: 12/16/2022 7:40 AM  Performed by: Julian Reil, CRNAPre-anesthesia Checklist: Patient identified, Emergency Drugs available, Suction available and Patient being monitored Patient Re-evaluated:Patient Re-evaluated prior to induction Oxygen Delivery Method: Nasal cannula Induction Type: IV induction Placement Confirmation: positive ETCO2

## 2022-12-16 NOTE — Transfer of Care (Signed)
Immediate Anesthesia Transfer of Care Note  Patient: Howard Bishop  Procedure(s) Performed: COLONOSCOPY WITH PROPOFOL POLYPECTOMY  Patient Location: Short Stay  Anesthesia Type:General  Level of Consciousness: drowsy  Airway & Oxygen Therapy: Patient Spontanous Breathing  Post-op Assessment: Report given to RN and Post -op Vital signs reviewed and stable  Post vital signs: Reviewed and stable  Last Vitals:  Vitals Value Taken Time  BP    Temp    Pulse    Resp    SpO2      Last Pain:  Vitals:   12/16/22 0736  TempSrc:   PainSc: 0-No pain      Patients Stated Pain Goal: 6 (12/16/22 0630)  Complications: No notable events documented.

## 2022-12-16 NOTE — Op Note (Signed)
[facility] Patient Name: Howard Bishop Procedure Date: 12/16/2022 7:14 AM MRN: [id number] Date of Birth: February 08, 1976 Attending MD: Sanjuan Dame , MD, [id number] CSN: [id number] Age: 47 Admit Type: Outpatient Procedure:                Colonoscopy Indications:              Screening for colorectal malignant neoplasm Providers:                Sanjuan Dame, MD, Angelica Ran, Lennice Sites                            Technician, Technician Referring MD:              Medicines:                Monitored Anesthesia Care Complications:            No immediate complications. Estimated Blood Loss:     Estimated blood loss: none. Procedure:                Pre-Anesthesia Assessment:                           - Prior to the procedure, a History and Physical                            was performed, and patient medications and                            allergies were reviewed. The patient's tolerance of                            previous anesthesia was also reviewed. The risks                            and benefits of the procedure and the sedation                            options and risks were discussed with the patient.                            All questions were answered, and informed consent                            was obtained. Prior Anticoagulants: The patient has                            taken no anticoagulant or antiplatelet agents. ASA                            Grade Assessment: III - A patient with severe                            systemic disease. After reviewing the risks and  benefits, the patient was deemed in satisfactory                            condition to undergo the procedure.                           After obtaining informed consent, the colonoscope                            was passed under direct vision. Throughout the                            procedure, the patient's blood pressure, pulse, and                             oxygen saturations were monitored continuously. The                            [id number]) scope was introduced through the                            anus and advanced to the the cecum, identified by                            appendiceal orifice and ileocecal valve. The                            colonoscopy was performed without difficulty. The                            patient tolerated the procedure well. The quality                            of the bowel preparation was evaluated using the                            BBPS [facility] Bowel Preparation Scale) with scores                            of: Right Colon = 2 (minor amount of residual                            staining, small fragments of stool and/or opaque                            liquid, but mucosa seen well), Transverse Colon = 2                            (minor amount of residual staining, small fragments                            of stool and/or opaque liquid, but mucosa seen  well) and Left Colon = 2 (minor amount of residual                            staining, small fragments of stool and/or opaque                            liquid, but mucosa seen well). The total BBPS score                            equals 6. The ileocecal valve, appendiceal orifice,                            and rectum were photographed. Scope In: 7:40:09 AM Scope Out: 7:57:27 AM Scope Withdrawal Time: 0 hours 14 minutes 6 seconds  Total Procedure Duration: 0 hours 17 minutes 18 seconds  Findings:      The perianal and digital rectal examinations were normal.      A 7 mm polyp was found in the transverse colon. The polyp was sessile.       The polyp was removed with a cold snare. Resection and retrieval were       complete.      A few diverticula were found in the left colon.      Non-bleeding external and internal hemorrhoids were found during       retroflexion. Impression:               - One 7 mm polyp in  the transverse colon, removed                            with a cold snare. Resected and retrieved.                           - Diverticulosis in the left colon.                           - Non-bleeding external and internal hemorrhoids. Moderate Sedation:      Per Anesthesia Care Recommendation:           - Patient has a contact number available for                            emergencies. The signs and symptoms of potential                            delayed complications were discussed with the                            patient. Return to normal activities tomorrow.                            Written discharge instructions were provided to the                            patient.                           -  Resume previous diet.                           - Continue present medications.                           - Await pathology results.                           - Repeat colonoscopy in 7 years for surveillance.                           - Return to primary care physician as previously                            scheduled. Procedure Code(s):        --- Professional ---                           [phone], Colonoscopy, flexible; with removal of                            tumor(s), polyp(s), or other lesion(s) by snare                            technique Diagnosis Code(s):        --- Professional ---                           Z12.11, Encounter for screening for malignant                            neoplasm of colon                           D12.3, Benign neoplasm of transverse colon (hepatic                            flexure or splenic flexure)                           K64.8, Other hemorrhoids                           K57.30, Diverticulosis of large intestine without                            perforation or abscess without bleeding CPT copyright 2022 American Medical Association. All rights reserved. The codes documented in this report are preliminary and upon coder review may  be  revised to meet current compliance requirements. Sanjuan Dame, MD Sanjuan Dame, MD 12/16/2022 8:05:56 AM This report has been signed electronically. Number of Addenda: 0

## 2022-12-17 LAB — SURGICAL PATHOLOGY

## 2022-12-20 NOTE — Anesthesia Postprocedure Evaluation (Signed)
Anesthesia Post Note  Patient: Howard Bishop  Procedure(s) Performed: COLONOSCOPY WITH PROPOFOL POLYPECTOMY  Patient location during evaluation: Phase II Anesthesia Type: General Level of consciousness: awake Pain management: pain level controlled Vital Signs Assessment: post-procedure vital signs reviewed and stable Respiratory status: spontaneous breathing and respiratory function stable Cardiovascular status: blood pressure returned to baseline and stable Postop Assessment: no headache and no apparent nausea or vomiting Anesthetic complications: no Comments: Late entry   No notable events documented.   Last Vitals:  Vitals:   12/16/22 0630 12/16/22 0801  BP: (!) 141/83 118/70  Pulse: 75 68  Resp: 16 16  Temp: 36.5 C 36.4 C  SpO2: 98% 97%    Last Pain:  Vitals:   12/16/22 0801  TempSrc: Axillary  PainSc: Asleep                 Windell Norfolk

## 2022-12-21 ENCOUNTER — Other Ambulatory Visit (HOSPITAL_COMMUNITY): Payer: BC Managed Care – PPO

## 2022-12-21 NOTE — Progress Notes (Signed)
I reviewed the pathology results. Ann, can you send her a letter with the findings as described below please? Repeat colonoscopy in 7 years  Thanks,  Vista Lawman, MD Gastroenterology and Hepatology [facility]  ---------------------------------------------------------------------------------------------  [facility] [address], [address], [place], [place] [address] Phone:  [phone]   12/21/22 [name], [place]   Dear Howard Bishop,  I am writing to inform you that the biopsies taken during your recent endoscopic examination showed: Tubular Adenoma   I am writing to let you know the results of your recent colonoscopy.  You had a total of 1 polyps removed. The pathology came back as "tubular adenoma." These findings are NOT cancer, but had the polyps remained in your colon, they could have turned into cancer.  Given these findings, it is recommended that your next colonoscopy be performed in 7 years.  Please call us at [phone] if you have persistent problems or have questions about your condition that have not been fully answered at this time.  Sincerely,  Vista Lawman, MD Gastroenterology and Hepatology

## 2022-12-24 ENCOUNTER — Encounter (INDEPENDENT_AMBULATORY_CARE_PROVIDER_SITE_OTHER): Payer: Self-pay | Admitting: *Deleted

## 2022-12-27 ENCOUNTER — Encounter (HOSPITAL_COMMUNITY): Payer: Self-pay | Admitting: Gastroenterology

## 2023-01-13 DIAGNOSIS — F17200 Nicotine dependence, unspecified, uncomplicated: Secondary | ICD-10-CM | POA: Diagnosis not present

## 2023-01-13 DIAGNOSIS — F419 Anxiety disorder, unspecified: Secondary | ICD-10-CM | POA: Diagnosis not present

## 2023-01-13 DIAGNOSIS — F17210 Nicotine dependence, cigarettes, uncomplicated: Secondary | ICD-10-CM | POA: Diagnosis not present

## 2023-02-10 DIAGNOSIS — Z0000 Encounter for general adult medical examination without abnormal findings: Secondary | ICD-10-CM | POA: Diagnosis not present

## 2023-02-10 DIAGNOSIS — I10 Essential (primary) hypertension: Secondary | ICD-10-CM | POA: Diagnosis not present

## 2023-02-17 DIAGNOSIS — E785 Hyperlipidemia, unspecified: Secondary | ICD-10-CM | POA: Diagnosis not present

## 2023-02-17 DIAGNOSIS — Z0001 Encounter for general adult medical examination with abnormal findings: Secondary | ICD-10-CM | POA: Diagnosis not present

## 2023-02-17 DIAGNOSIS — J45909 Unspecified asthma, uncomplicated: Secondary | ICD-10-CM | POA: Diagnosis not present

## 2023-02-17 DIAGNOSIS — F419 Anxiety disorder, unspecified: Secondary | ICD-10-CM | POA: Diagnosis not present

## 2023-02-17 DIAGNOSIS — R748 Abnormal levels of other serum enzymes: Secondary | ICD-10-CM | POA: Diagnosis not present

## 2023-02-17 DIAGNOSIS — Z23 Encounter for immunization: Secondary | ICD-10-CM | POA: Diagnosis not present

## 2023-02-17 DIAGNOSIS — F17210 Nicotine dependence, cigarettes, uncomplicated: Secondary | ICD-10-CM | POA: Diagnosis not present

## 2023-06-10 DIAGNOSIS — E785 Hyperlipidemia, unspecified: Secondary | ICD-10-CM | POA: Diagnosis not present

## 2023-06-16 DIAGNOSIS — Z0000 Encounter for general adult medical examination without abnormal findings: Secondary | ICD-10-CM | POA: Diagnosis not present

## 2023-06-16 DIAGNOSIS — E785 Hyperlipidemia, unspecified: Secondary | ICD-10-CM | POA: Diagnosis not present

## 2023-06-16 DIAGNOSIS — R748 Abnormal levels of other serum enzymes: Secondary | ICD-10-CM | POA: Diagnosis not present

## 2023-06-16 DIAGNOSIS — F209 Schizophrenia, unspecified: Secondary | ICD-10-CM | POA: Diagnosis not present

## 2023-06-16 DIAGNOSIS — J45909 Unspecified asthma, uncomplicated: Secondary | ICD-10-CM | POA: Diagnosis not present

## 2023-06-16 DIAGNOSIS — I10 Essential (primary) hypertension: Secondary | ICD-10-CM | POA: Diagnosis not present

## 2023-06-30 DIAGNOSIS — F209 Schizophrenia, unspecified: Secondary | ICD-10-CM | POA: Diagnosis not present

## 2023-06-30 DIAGNOSIS — F419 Anxiety disorder, unspecified: Secondary | ICD-10-CM | POA: Diagnosis not present

## 2023-06-30 DIAGNOSIS — J45909 Unspecified asthma, uncomplicated: Secondary | ICD-10-CM | POA: Diagnosis not present

## 2023-06-30 DIAGNOSIS — R748 Abnormal levels of other serum enzymes: Secondary | ICD-10-CM | POA: Diagnosis not present

## 2023-06-30 DIAGNOSIS — R109 Unspecified abdominal pain: Secondary | ICD-10-CM | POA: Diagnosis not present

## 2023-07-19 DIAGNOSIS — Z91148 Patient's other noncompliance with medication regimen for other reason: Secondary | ICD-10-CM | POA: Diagnosis not present

## 2023-07-19 DIAGNOSIS — F333 Major depressive disorder, recurrent, severe with psychotic symptoms: Secondary | ICD-10-CM | POA: Diagnosis not present

## 2023-07-19 DIAGNOSIS — I10 Essential (primary) hypertension: Secondary | ICD-10-CM | POA: Diagnosis not present

## 2023-07-19 DIAGNOSIS — E7800 Pure hypercholesterolemia, unspecified: Secondary | ICD-10-CM | POA: Diagnosis not present

## 2023-07-19 DIAGNOSIS — F1020 Alcohol dependence, uncomplicated: Secondary | ICD-10-CM | POA: Diagnosis not present

## 2023-07-19 DIAGNOSIS — R45851 Suicidal ideations: Secondary | ICD-10-CM | POA: Diagnosis not present

## 2023-07-19 DIAGNOSIS — J449 Chronic obstructive pulmonary disease, unspecified: Secondary | ICD-10-CM | POA: Diagnosis not present

## 2023-07-19 DIAGNOSIS — F250 Schizoaffective disorder, bipolar type: Secondary | ICD-10-CM | POA: Diagnosis not present

## 2023-07-19 DIAGNOSIS — F411 Generalized anxiety disorder: Secondary | ICD-10-CM | POA: Diagnosis not present

## 2023-07-19 DIAGNOSIS — F332 Major depressive disorder, recurrent severe without psychotic features: Secondary | ICD-10-CM | POA: Diagnosis not present

## 2023-07-19 DIAGNOSIS — F339 Major depressive disorder, recurrent, unspecified: Secondary | ICD-10-CM | POA: Diagnosis not present

## 2023-08-11 DIAGNOSIS — F209 Schizophrenia, unspecified: Secondary | ICD-10-CM | POA: Diagnosis not present

## 2023-08-11 DIAGNOSIS — J45909 Unspecified asthma, uncomplicated: Secondary | ICD-10-CM | POA: Diagnosis not present

## 2023-08-11 DIAGNOSIS — F1020 Alcohol dependence, uncomplicated: Secondary | ICD-10-CM | POA: Diagnosis not present

## 2023-08-11 DIAGNOSIS — F419 Anxiety disorder, unspecified: Secondary | ICD-10-CM | POA: Diagnosis not present

## 2023-09-08 DIAGNOSIS — E785 Hyperlipidemia, unspecified: Secondary | ICD-10-CM | POA: Diagnosis not present

## 2023-10-21 DIAGNOSIS — F250 Schizoaffective disorder, bipolar type: Secondary | ICD-10-CM | POA: Diagnosis not present

## 2023-10-24 DIAGNOSIS — F1220 Cannabis dependence, uncomplicated: Secondary | ICD-10-CM | POA: Diagnosis not present

## 2023-10-27 DIAGNOSIS — F329 Major depressive disorder, single episode, unspecified: Secondary | ICD-10-CM | POA: Diagnosis not present

## 2023-10-31 DIAGNOSIS — F1020 Alcohol dependence, uncomplicated: Secondary | ICD-10-CM | POA: Diagnosis not present

## 2023-12-29 ENCOUNTER — Other Ambulatory Visit (HOSPITAL_COMMUNITY): Payer: Self-pay | Admitting: Nurse Practitioner

## 2023-12-29 DIAGNOSIS — R200 Anesthesia of skin: Secondary | ICD-10-CM

## 2023-12-29 DIAGNOSIS — M542 Cervicalgia: Secondary | ICD-10-CM | POA: Diagnosis not present

## 2023-12-29 DIAGNOSIS — G8929 Other chronic pain: Secondary | ICD-10-CM | POA: Diagnosis not present

## 2023-12-29 DIAGNOSIS — R079 Chest pain, unspecified: Secondary | ICD-10-CM | POA: Diagnosis not present

## 2023-12-30 ENCOUNTER — Other Ambulatory Visit (HOSPITAL_BASED_OUTPATIENT_CLINIC_OR_DEPARTMENT_OTHER): Payer: Self-pay | Admitting: Nurse Practitioner

## 2023-12-30 DIAGNOSIS — R079 Chest pain, unspecified: Secondary | ICD-10-CM

## 2024-01-02 ENCOUNTER — Ambulatory Visit (HOSPITAL_COMMUNITY)
Admission: RE | Admit: 2024-01-02 | Discharge: 2024-01-02 | Disposition: A | Discharge status: Home / Self Care | Source: Ambulatory Visit | Attending: Nurse Practitioner | Admitting: Nurse Practitioner

## 2024-01-02 DIAGNOSIS — M47812 Spondylosis without myelopathy or radiculopathy, cervical region: Secondary | ICD-10-CM | POA: Diagnosis not present

## 2024-01-02 DIAGNOSIS — R200 Anesthesia of skin: Secondary | ICD-10-CM | POA: Diagnosis not present

## 2024-01-02 DIAGNOSIS — M5033 Other cervical disc degeneration, cervicothoracic region: Secondary | ICD-10-CM | POA: Diagnosis not present

## 2024-01-02 DIAGNOSIS — M50321 Other cervical disc degeneration at C4-C5 level: Secondary | ICD-10-CM | POA: Diagnosis not present

## 2024-01-02 DIAGNOSIS — M47813 Spondylosis without myelopathy or radiculopathy, cervicothoracic region: Secondary | ICD-10-CM | POA: Diagnosis not present

## 2024-01-04 ENCOUNTER — Ambulatory Visit (HOSPITAL_COMMUNITY)
Admission: RE | Admit: 2024-01-04 | Discharge: 2024-01-04 | Disposition: A | Discharge status: Home / Self Care | Source: Ambulatory Visit | Attending: Vascular Surgery | Admitting: Vascular Surgery

## 2024-01-04 DIAGNOSIS — R079 Chest pain, unspecified: Secondary | ICD-10-CM | POA: Insufficient documentation

## 2024-01-04 LAB — ECHOCARDIOGRAM COMPLETE
AR max vel: 4.51 cm2
AV Area VTI: 4.3 cm2
AV Area mean vel: 4.24 cm2
AV Mean grad: 3 mmHg
AV Peak grad: 6.1 mmHg
Ao pk vel: 1.23 m/s
Area-P 1/2: 4.39 cm2
MV M vel: 4.32 m/s
MV Peak grad: 74.6 mmHg
S' Lateral: 3.87 cm

## 2024-01-07 ENCOUNTER — Ambulatory Visit
Admission: RE | Admit: 2024-01-07 | Discharge: 2024-01-07 | Disposition: A | Discharge status: Home / Self Care | Source: Ambulatory Visit | Attending: Family Medicine

## 2024-01-07 VITALS — BP 145/95 | HR 83 | Temp 98.3°F | Resp 20

## 2024-01-07 DIAGNOSIS — M5412 Radiculopathy, cervical region: Secondary | ICD-10-CM

## 2024-01-07 MED ORDER — PREDNISONE 20 MG PO TABS: 40.0000 mg | ORAL_TABLET | Freq: Every day | ORAL | 0 refills | Status: DC

## 2024-01-07 MED ORDER — TIZANIDINE HCL 4 MG PO CAPS: 4.0000 mg | ORAL_CAPSULE | Freq: Three times a day (TID) | ORAL | 0 refills | Status: DC | PRN

## 2024-01-07 NOTE — ED Triage Notes (Signed)
 Pt reports he has posterior neck pain that radiates to his shoulders and both arms get numb and  then he gets chills on and off for 30 min  at a time  x 1 day

## 2024-01-07 NOTE — ED Provider Notes (Signed)
 [facility]    CSN: [id number] Arrival date & time: 01/07/24  1251      History   Chief Complaint Chief Complaint  Patient presents with   Neck Pain    HPI Howard Bishop is a 48 y.o. male.   Patient presenting today with progressively worsening posterior neck pain now radiating pain down bilateral arms and causing numbness and cool sensation to hands over the past 5 days.  States he recently had an x-ray of his neck but has not heard about the results yet as his provider was closed yesterday.  Denies known injury to the area, swelling, discoloration, dizziness.  So far trying ibuprofen  and Tylenol , heat and cold with minimal temporary relief.    Past Medical History:  Diagnosis Date   ADHD (attention deficit hyperactivity disorder)    Anxiety    Bipolar disorder (HCC)    Chronic kidney disease    COPD (chronic obstructive pulmonary disease) (HCC)    DDD (degenerative disc disease), cervical    Dyspnea    Gallstone    Headache(784.0)    Hepatitis C antibody test positive    Hx of concussion    Hypertension    PTSD (post-traumatic stress disorder)    Schizoaffective disorder (HCC)    Seizures (HCC)     Patient Active Problem List   Diagnosis Date Noted   Adenomatous polyp of transverse colon 12/16/2022   Seizures (HCC) 04/01/2015   Alcohol abuse 01/18/2014   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   PTSD (post-traumatic stress disorder)    Hepatitis C antibody test positive    Headache     Past Surgical History:  Procedure Laterality Date   CHOLECYSTECTOMY     COLONOSCOPY WITH PROPOFOL  N/A 12/16/2022   Procedure: COLONOSCOPY WITH PROPOFOL ;  Surgeon: Cinderella Deatrice FALCON, MD;  [facility];  Service: Endoscopy;  Laterality: N/A;  7:30 am, asa 3   POLYPECTOMY  12/16/2022   Procedure: POLYPECTOMY;  Surgeon: Cinderella Deatrice FALCON, MD;  [facility];  Service: Endoscopy;;   RENAL ARTERY STENT     VASECTOMY   12/2021       Home Medications    Prior to Admission medications   Medication Sig Start Date End Date Taking? Authorizing Provider  predniSONE  (DELTASONE ) 20 MG tablet Take 2 tablets (40 mg total) by mouth daily with breakfast. 01/07/24  Yes Stuart Vernell Norris, PA-C  tiZANidine (ZANAFLEX) 4 MG capsule Take 1 capsule (4 mg total) by mouth 3 (three) times daily as needed for muscle spasms. Do not drink alcohol or drive while taking this medication.  May cause drowsiness. 01/07/24  Yes Stuart Vernell Norris, PA-C  acetaminophen  (TYLENOL ) 500 MG tablet Take 500 mg by mouth every 6 (six) hours as needed.    [provider]  albuterol  (VENTOLIN  HFA) 108 (90 Base) MCG/ACT inhaler Inhale 2 puffs into the lungs every 4 (four) hours as needed for wheezing or shortness of breath. 03/09/22   Stuart Vernell Norris, PA-C  losartan (COZAAR) 25 MG tablet Take 1 tablet by mouth daily.    [provider]  TRELEGY ELLIPTA 100-62.5-25 MCG/ACT AEPB Inhale 1 puff into the lungs daily. 10/16/22   [provider]    Family History Family History  Problem Relation Age of Onset   Drug abuse Mother    Alcohol abuse Father    Anxiety disorder Sister    Drug abuse Sister    Colon cancer Paternal Grandmother  72s    Social History Social History   Tobacco Use   Smoking status: Every Day    Current packs/day: 1.00    Average packs/day: 1 pack/day for 33.8 years (33.8 ttl pk-yrs)    Types: Cigarettes    Start date: 03/29/1990   Smokeless tobacco: Former    Types: Engineer, drilling   Vaping status: Never Used  Substance Use Topics   Alcohol use: Yes    Comment: 24 oz beer a day   Drug use: Yes    Frequency: 7.0 times per week    Types: Marijuana    Comment: 3 grams per day     Allergies   Aspirin, Penicillins, Tomato, Ambien [zolpidem], Bee venom, Ketorolac, Paroxetine hcl, Tramadol hcl, and Ultram [tramadol]   Review of Systems Review of Systems PER  HPI  Physical Exam Triage Vital Signs ED Triage Vitals  Encounter Vitals Group     BP 01/07/24 1324 (!) 145/95     Girls Systolic BP Percentile --      Girls Diastolic BP Percentile --      Boys Systolic BP Percentile --      Boys Diastolic BP Percentile --      Pulse Rate 01/07/24 1324 83     Resp 01/07/24 1324 20     Temp 01/07/24 1324 98.3 F (36.8 C)     Temp Source 01/07/24 1324 Oral     SpO2 01/07/24 1324 96 %     Weight --      Height --      Head Circumference --      Peak Flow --      Pain Score 01/07/24 1322 7     Pain Loc --      Pain Education --      Exclude from Growth Chart --    No data found.  Updated Vital Signs BP (!) 145/95 (BP Location: Right Arm)   Pulse 83   Temp 98.3 F (36.8 C) (Oral)   Resp 20   SpO2 96%   Visual Acuity Right Eye Distance:   Left Eye Distance:   Bilateral Distance:    Right Eye Near:   Left Eye Near:    Bilateral Near:     Physical Exam Vitals and nursing note reviewed.  Constitutional:      Appearance: Normal appearance.  HENT:     Head: Atraumatic.     Mouth/Throat:     Mouth: Mucous membranes are moist.  Eyes:     Extraocular Movements: Extraocular movements intact.     Conjunctiva/sclera: Conjunctivae normal.  Cardiovascular:     Rate and Rhythm: Normal rate.  Pulmonary:     Effort: Pulmonary effort is normal.  Musculoskeletal:        General: Tenderness present. No swelling or signs of injury. Normal range of motion.     Cervical back: Normal range of motion and neck supple.     Comments: Tenderness to palpation to the midline cervical spine and cervical paraspinal muscles bilaterally.  Range of motion intact  Skin:    General: Skin is warm and dry.     Findings: No erythema or rash.  Neurological:     Mental Status: He is oriented to person, place, and time.     Motor: No weakness.     Gait: Gait normal.     Comments: States symmetric slightly decreased sensation to light touch to bilateral upper  extremities, bilateral grip strength weakness  Psychiatric:  Mood and Affect: Mood normal.        Thought Content: Thought content normal.        Judgment: Judgment normal.      UC Treatments / Results  Labs (all labs ordered are listed, but only abnormal results are displayed) Labs Reviewed - No data to display  EKG   Radiology No results found.  Procedures Procedures (including critical care time)  Medications Ordered in UC Medications - No data to display  Initial Impression / Assessment and Plan / UC Course  I have reviewed the triage vital signs and the nursing notes.  Pertinent labs & imaging results that were available during my care of the patient were reviewed by me and considered in my medical decision making (see chart for details).     X-ray of the cervical spine performed 01/05/2024 per chart review, this was showing cervical degenerative disc disease without acute abnormalities.  Suspect cervical radiculopathy to be causing his symptoms.  Will trial prednisone , Zanaflex, supportive over-the-counter medications and home care and follow-up as soon as possible with provider who ordered imaging for further evaluation and recommendations.  Return for worsening symptoms.  Final Clinical Impressions(s) / UC Diagnoses   Final diagnoses:  Cervical radiculopathy   Discharge Instructions   None    ED Prescriptions     Medication Sig Dispense Auth. Provider   predniSONE  (DELTASONE ) 20 MG tablet Take 2 tablets (40 mg total) by mouth daily with breakfast. 10 tablet Stuart Vernell Norris, PA-C   tiZANidine (ZANAFLEX) 4 MG capsule Take 1 capsule (4 mg total) by mouth 3 (three) times daily as needed for muscle spasms. Do not drink alcohol or drive while taking this medication.  May cause drowsiness. 15 capsule Stuart Vernell Norris, [place]      PDMP not reviewed this encounter.   Stuart Vernell Norris, PA-C 01/07/24 1344

## 2024-01-31 ENCOUNTER — Other Ambulatory Visit (HOSPITAL_COMMUNITY): Payer: Self-pay | Admitting: Surgery

## 2024-01-31 DIAGNOSIS — M4722 Other spondylosis with radiculopathy, cervical region: Secondary | ICD-10-CM

## 2024-02-08 ENCOUNTER — Ambulatory Visit (HOSPITAL_COMMUNITY)
Admission: RE | Admit: 2024-02-08 | Discharge: 2024-02-08 | Disposition: A | Discharge status: Home / Self Care | Source: Ambulatory Visit | Attending: Surgery | Admitting: Surgery

## 2024-02-08 DIAGNOSIS — M50221 Other cervical disc displacement at C4-C5 level: Secondary | ICD-10-CM | POA: Diagnosis not present

## 2024-02-08 DIAGNOSIS — M4802 Spinal stenosis, cervical region: Secondary | ICD-10-CM | POA: Diagnosis not present

## 2024-02-08 DIAGNOSIS — M47812 Spondylosis without myelopathy or radiculopathy, cervical region: Secondary | ICD-10-CM | POA: Diagnosis not present

## 2024-02-08 DIAGNOSIS — M5021 Other cervical disc displacement,  high cervical region: Secondary | ICD-10-CM | POA: Diagnosis not present

## 2024-02-08 DIAGNOSIS — M4722 Other spondylosis with radiculopathy, cervical region: Secondary | ICD-10-CM | POA: Insufficient documentation

## 2024-02-20 DIAGNOSIS — M4722 Other spondylosis with radiculopathy, cervical region: Secondary | ICD-10-CM | POA: Diagnosis not present

## 2024-02-21 ENCOUNTER — Ambulatory Visit: Admitting: Internal Medicine

## 2024-03-13 ENCOUNTER — Encounter: Payer: Self-pay | Admitting: Cardiology

## 2024-03-13 ENCOUNTER — Ambulatory Visit: Attending: Cardiology | Admitting: Cardiology

## 2024-03-13 VITALS — BP 144/88 | HR 86 | Ht 75.0 in | Wt 175.8 lb

## 2024-03-13 DIAGNOSIS — I10 Essential (primary) hypertension: Secondary | ICD-10-CM

## 2024-03-13 DIAGNOSIS — F17210 Nicotine dependence, cigarettes, uncomplicated: Secondary | ICD-10-CM | POA: Diagnosis not present

## 2024-03-13 DIAGNOSIS — F1210 Cannabis abuse, uncomplicated: Secondary | ICD-10-CM

## 2024-03-13 DIAGNOSIS — E782 Mixed hyperlipidemia: Secondary | ICD-10-CM | POA: Diagnosis not present

## 2024-03-13 DIAGNOSIS — R072 Precordial pain: Secondary | ICD-10-CM | POA: Diagnosis not present

## 2024-03-13 MED ORDER — METOPROLOL TARTRATE 50 MG PO TABS: 50.0000 mg | ORAL_TABLET | Freq: Two times a day (BID) | ORAL | 0 refills | Status: DC

## 2024-03-13 NOTE — Progress Notes (Deleted)
 Cardiology Office Note:    Date:  03/13/2024  NAME:  LORIMER TIBERIO    MRN: [id number] DOB:  October 08, 1975   PCP:  Joeann Browning, FNP  Former Cardiology Providers: *** Primary Cardiologist:  None, [facility] (established care 03/13/2024) Electrophysiologist:  None   Referring MD: Joeann Browning, FNP  Reason of Consult: Chest pain  No chief complaint on file.   History of Present Illness:    ALOK MINSHALL is a 48 y.o. Caucasian male whose past medical history and cardiovascular risk factors includes: Hypertension, ***.   He is being seen today for the evaluation of chest pain at the request of Joeann Browning, FNP.  ***  Current Medications: Active Medications[1]   Allergies:    Aspirin, Penicillins, Tomato, Ambien [zolpidem], Bee venom, Ketorolac, Paroxetine hcl, Tramadol hcl, and Ultram [tramadol]   Past Medical History: Past Medical History:  Diagnosis Date   ADHD (attention deficit hyperactivity disorder)    Anxiety    Bipolar disorder (HCC)    Chronic kidney disease    COPD (chronic obstructive pulmonary disease) (HCC)    DDD (degenerative disc disease), cervical    Dyspnea    Gallstone    Headache(784.0)    Hepatitis C antibody test positive    Hx of concussion    Hypertension    PTSD (post-traumatic stress disorder)    Schizoaffective disorder (HCC)    Seizures (HCC)     Past Surgical History: Past Surgical History:  Procedure Laterality Date   CHOLECYSTECTOMY     COLONOSCOPY WITH PROPOFOL  N/A 12/16/2022   Procedure: COLONOSCOPY WITH PROPOFOL ;  Surgeon: Cinderella Deatrice FALCON, MD;  [facility];  Service: Endoscopy;  Laterality: N/A;  7:30 am, asa 3   POLYPECTOMY  12/16/2022   Procedure: POLYPECTOMY;  Surgeon: Cinderella Deatrice FALCON, MD;  [facility];  Service: Endoscopy;;   RENAL ARTERY STENT     VASECTOMY  12/2021    Social History: Social History[2]  Family History: Family History  Problem Relation Age of Onset   Drug  abuse Mother    Alcohol abuse Father    Anxiety disorder Sister    Drug abuse Sister    Colon cancer Paternal Grandmother        48s    ROS:   ROS  Studies Reviewed:   EKG: EKG Interpretation Date/Time:  Tuesday March 13 2024 15:03:47 EST Ventricular Rate:  87 PR Interval:  126 QRS Duration:  92 QT Interval:  344 QTC Calculation: 413 R Axis:   91  Text Interpretation: Normal sinus rhythm Right atrial enlargement Rightward axis When compared with ECG of 14-Dec-2022 08:06, Incomplete right bundle branch block is no longer Present Confirmed by Michele Richardson [phone]) on 03/13/2024 3:27:30 PM  Echocardiogram: January 04, 2024  1. Parasternal and short axis images were poor quality, which made this  study challenging to interpret.   2. Left ventricular ejection fraction, by estimation, is 55 to 60%. The  left ventricle has normal function. The left ventricle has no regional  wall motion abnormalities. There is mild concentric left ventricular  hypertrophy. Left ventricular diastolic  parameters were normal.   3. Right ventricular systolic function is normal. The right ventricular  size is normal. There is normal pulmonary artery systolic pressure.   4. There is no evidence of pericardial effusion.   5. The mitral valve is normal in structure. Trivial mitral valve  regurgitation. No evidence of mitral stenosis.   6. The aortic valve was not  well visualized. Aortic valve regurgitation  is not visualized. No aortic stenosis is present.   7. The inferior vena cava is normal in size with greater than 50%  respiratory variability, suggesting right atrial pressure of 3 mmHg.   Labs:    Latest Ref Rng & Units 04/26/2021    3:07 PM 03/06/2020    6:48 PM 05/11/2018    6:44 PM  CBC  WBC 4.0 - 10.5 K/uL 7.9  6.8  8.4   Hemoglobin 13.0 - 17.0 g/dL 83.8  84.1  83.0   Hematocrit 39.0 - 52.0 % 45.6  45.9  50.0   Platelets 150 - 400 K/uL 256  296  288        Latest Ref Rng & Units  04/26/2021    3:07 PM 03/06/2020    6:48 PM 05/11/2018    6:44 PM  BMP  Glucose 70 - 99 mg/dL 898  98  96   BUN 6 - 20 mg/dL 10  11  9    Creatinine 0.61 - 1.24 mg/dL 9.16  9.24  9.27   Sodium 135 - 145 mmol/L 134  138  137   Potassium 3.5 - 5.1 mmol/L 3.8  4.1  4.3   Chloride 98 - 111 mmol/L 105  105  104   CO2 22 - 32 mmol/L 23  24  23    Calcium 8.9 - 10.3 mg/dL 9.5  9.7  9.9       Latest Ref Rng & Units 04/26/2021    3:07 PM 03/06/2020    6:48 PM 05/11/2018    6:44 PM  CMP  Glucose 70 - 99 mg/dL 898  98  96   BUN 6 - 20 mg/dL 10  11  9    Creatinine 0.61 - 1.24 mg/dL 9.16  9.24  9.27   Sodium 135 - 145 mmol/L 134  138  137   Potassium 3.5 - 5.1 mmol/L 3.8  4.1  4.3   Chloride 98 - 111 mmol/L 105  105  104   CO2 22 - 32 mmol/L 23  24  23    Calcium 8.9 - 10.3 mg/dL 9.5  9.7  9.9     No results found for: CHOL, HDL, LDLCALC, LDLDIRECT, TRIG, CHOLHDL No results for input(s): LIPOA in the last 8760 hours. No components found for: NTPROBNP No results for input(s): PROBNP in the last 8760 hours. No results for input(s): TSH in the last 8760 hours.  Physical Exam:    Today's Vitals   03/13/24 1507  BP: (!) 144/88  Pulse: 86  SpO2: 98%  Weight: 175 lb 12.8 oz (79.7 kg)  Height: 6' 3 (1.905 m)   Body mass index is 21.97 kg/m. Wt Readings from Last 3 Encounters:  03/13/24 175 lb 12.8 oz (79.7 kg)  12/16/22 189 lb (85.7 kg)  12/14/22 187 lb 2.7 oz (84.9 kg)    Physical Exam   Impression & Recommendation(s):  Impression:   ICD-10-CM   1. Precordial pain  R07.2 EKG 12-Lead    2. Benign hypertension  I10        Recommendation(s):  ***  Orders Placed:  Orders Placed This Encounter  Procedures   EKG 12-Lead     Final Medication List:   No orders of the defined types were placed in this encounter.   Medications Discontinued During This Encounter  Medication Reason   acetaminophen  (TYLENOL ) 500 MG tablet Patient Preference   TRELEGY  ELLIPTA 100-62.5-25 MCG/ACT AEPB Patient Preference   tiZANidine  (  ZANAFLEX ) 4 MG capsule Patient Preference   predniSONE  (DELTASONE ) 20 MG tablet Patient Preference    Current Medications[3]  Consent:   ***  Disposition:   *** Patient may be asked to follow-up sooner based on the results of the above-mentioned testing.  @stmdm @  Signed, Madonna Large, DO, [facility]  A Division of [facility] [address] [place], [place] [address]      [1]  Current Meds  Medication Sig   albuterol  (VENTOLIN  HFA) 108 (90 Base) MCG/ACT inhaler Inhale 2 puffs into the lungs every 4 (four) hours as needed for wheezing or shortness of breath.   Fluticasone-Umeclidin-Vilant (TRELEGY ELLIPTA) 100-62.5-25 MCG/ACT AEPB Inhale into the lungs.   losartan (COZAAR) 25 MG tablet Take 1 tablet by mouth daily.  [2]  Social History Tobacco Use   Smoking status: Every Day    Current packs/day: 1.00    Average packs/day: 1 pack/day for 34.0 years (34.0 ttl pk-yrs)    Types: Cigarettes    Start date: 03/29/1990   Smokeless tobacco: Former    Types: Engineer, Drilling   Vaping status: Never Used  Substance Use Topics   Alcohol use: Yes    Comment: 24 oz beer a day   Drug use: Yes    Frequency: 7.0 times per week    Types: Marijuana    Comment: 3 grams per day  [3]  Current Outpatient Medications:    albuterol  (VENTOLIN  HFA) 108 (90 Base) MCG/ACT inhaler, Inhale 2 puffs into the lungs every 4 (four) hours as needed for wheezing or shortness of breath., Disp: 18 g, Rfl: 0   Fluticasone-Umeclidin-Vilant (TRELEGY ELLIPTA) 100-62.5-25 MCG/ACT AEPB, Inhale into the lungs., Disp: , Rfl:    losartan (COZAAR) 25 MG tablet, Take 1 tablet by mouth daily., Disp: , Rfl:

## 2024-03-13 NOTE — Patient Instructions (Signed)
 Medication Instructions:  TAKE metoprolol  Tartrate (Lopressor ) 50 mg. Take one (1) tablet by mouth twice daily for the week prior to your CT scan. *If you need a refill on your cardiac medications before your next appointment, please call your pharmacy*  Lab Work: BMP If you have labs (blood work) drawn today and your tests are completely normal, you will receive your results only by: MyChart Message (if you have MyChart) OR A paper copy in the mail If you have any lab test that is abnormal or we need to change your treatment, we will call you to review the results.  Testing/Procedures: Coronary CTA  Follow-Up: At [facility], you and your health needs are our priority.  As part of our continuing mission to provide you with exceptional heart care, our providers are all part of one team.  This team includes your primary Cardiologist (physician) and Advanced Practice Providers or APPs (Physician Assistants and Nurse Practitioners) who all work together to provide you with the care you need, when you need it.  Your next appointment:   4 week(s)  Provider:   Madonna Large, DO    We recommend signing up for the patient portal called MyChart.  Sign up information is provided on this After Visit Summary.  MyChart is used to connect with patients for Virtual Visits (Telemedicine).  Patients are able to view lab/test results, encounter notes, upcoming appointments, etc.  Non-urgent messages can be sent to your provider as well.   To learn more about what you can do with MyChart, go to [email or web address].   Other Instructions    Your cardiac CT will be scheduled at one of the below locations:    [facility] [address]  [place], [place] [address]   At the [facility] at [address], please enter the parking lot using the [address] entrance and use the FREE valet service at the patient drop-off area. Enter the building  and check-in with registration on the main floor.   Please follow these instructions carefully (unless otherwise directed):  An IV will be required for this test and Nitroglycerin will be given.  Hold all erectile dysfunction medications at least 3 days (72 hrs) prior to test. (Ie viagra, cialis, sildenafil, tadalafil, etc)   On the Night Before the Test: Be sure to Drink plenty of water. Do not consume any caffeinated/decaffeinated beverages or chocolate 12 hours prior to your test. Do not take any antihistamines 12 hours prior to your test.  On the Day of the Test: Drink plenty of water until 1 hour prior to the test. Do not eat any food 1 hour prior to test. You may take your regular medications prior to the test.  Take metoprolol  (Lopressor ) for one week prior to test. Patients who wear a continuous glucose monitor MUST remove the device prior to scanning.   After the Test: Drink plenty of water. After receiving IV contrast, you may experience a mild flushed feeling. This is normal. On occasion, you may experience a mild rash up to 24 hours after the test. This is not dangerous. If this occurs, you can take Benadryl 25 mg, Zyrtec, Claritin, or Allegra and increase your fluid intake. (Patients taking Tikosyn should avoid Benadryl, and may take Zyrtec, Claritin, or Allegra) If you experience trouble breathing, this can be serious. If it is severe call 911 IMMEDIATELY. If it is mild, please call our office.  We will call to schedule your  test 2-4 weeks out understanding that some insurance companies will need an authorization prior to the service being performed.   For more information and frequently asked questions, please visit our website : [email or web address]  For non-scheduling related questions, please contact the cardiac imaging nurse navigator should you have any questions/concerns: Cardiac Imaging Nurse Navigators Direct Office Dial: [phone]   For  scheduling needs, including cancellations and rescheduling, please call Brittany, [phone].

## 2024-03-13 NOTE — Progress Notes (Signed)
 Cardiology Office Note:    Date:  03/13/2024  NAME:  Howard Bishop    MRN: [id number] DOB:  [birth year]   PCP:  Joeann Browning, FNP  Former Cardiology Providers: None Primary Cardiologist:  Madonna Large, DO, [facility] (established care 03/13/2024) Electrophysiologist:  None   Referring MD: Joeann Browning, FNP  Reason of Consult: Chest pain  Chief Complaint  Patient presents with   Chest Pain    History of Present Illness:    Howard Bishop is a 48 y.o. Caucasian male whose past medical history and cardiovascular risk factors includes: HTN, HLD, smoking, marijuana.   He is being seen today for the evaluation of chest pain  at the request of Joeann Browning, FNP.  Chest Pain Onset: month ago Last occurrence: four days ago Location: left chest and radiates to the left arm Intensity: 7/10 Frequency: weekly Duration: minutes  Improving factors: at times improves with tylenol   Worsening factors: walking Do symptoms worsen with effort related activities: Yes Do symptoms improve with resting or sublingual nitroglycerin tabs: Yes Risk Factors: Hypertension, Lipids, Smoker, and family hx of heart disease.  Walks 0.5 mile twice a week.   No first degree relatives with premature coronary disease or sudden cardiac death.  Current Medications: Active Medications[1]   Allergies:    Aspirin, Penicillins, Tomato, Ambien [zolpidem], Bee venom, Ketorolac, Paroxetine hcl, Tramadol hcl, and Ultram [tramadol]   Past Medical History: Past Medical History:  Diagnosis Date   ADHD (attention deficit hyperactivity disorder)    Anxiety    Bipolar disorder (HCC)    Chronic kidney disease    COPD (chronic obstructive pulmonary disease) (HCC)    DDD (degenerative disc disease), cervical    Dyspnea    Gallstone    Headache(784.0)    Hepatitis C antibody test positive    Hx of concussion    Hypertension    PTSD (post-traumatic stress disorder)    Schizoaffective disorder  (HCC)    Seizures (HCC)     Past Surgical History: Past Surgical History:  Procedure Laterality Date   CHOLECYSTECTOMY     COLONOSCOPY WITH PROPOFOL  N/A 12/16/2022   Procedure: COLONOSCOPY WITH PROPOFOL ;  Surgeon: Cinderella Deatrice FALCON, MD;  [facility];  Service: Endoscopy;  Laterality: N/A;  7:30 am, asa 3   POLYPECTOMY  12/16/2022   Procedure: POLYPECTOMY;  Surgeon: Cinderella Deatrice FALCON, MD;  Location: [facility];  Service: Endoscopy;;   RENAL ARTERY STENT     VASECTOMY  12/2021  Denies hx of renal stent.   Social History: Social History   Tobacco Use   Smoking status: Every Day    Current packs/day: 1.00    Average packs/day: 1 pack/day for 34.0 years (34.0 ttl pk-yrs)    Types: Cigarettes    Start date: 03/29/1990   Smokeless tobacco: Former    Types: Engineer, Drilling   Vaping status: Never Used  Substance Use Topics   Alcohol use: Yes    Comment: 24 oz beer a day   Drug use: Yes    Frequency: 7.0 times per week    Types: Marijuana    Comment: 3 grams per day    Family History: Family History  Problem Relation Age of Onset   Drug abuse Mother    Alcohol abuse Father    Anxiety disorder Sister    Drug abuse Sister    Colon cancer Paternal Grandmother        3s    ROS:  Review of Systems  Cardiovascular:  Negative for chest pain, claudication, irregular heartbeat, leg swelling, near-syncope, orthopnea, palpitations, paroxysmal nocturnal dyspnea and syncope.  Respiratory:  Negative for shortness of breath.   Hematologic/Lymphatic: Negative for bleeding problem.    Studies Reviewed:   EKG: EKG Interpretation Date/Time:  Tuesday March 13 2024 15:03:47 EST Ventricular Rate:  87 PR Interval:  126 QRS Duration:  92 QT Interval:  344 QTC Calculation: 413 R Axis:   91  Text Interpretation: Normal sinus rhythm Right atrial enlargement Rightward axis When compared with ECG of 14-Dec-2022 08:06, Incomplete right bundle branch block is no longer  Present Confirmed by Michele Richardson [phone]) on 03/13/2024 3:27:30 PM  Echocardiogram: 01/04/2024  1. Parasternal and short axis images were poor quality, which made this  study challenging to interpret.   2. Left ventricular ejection fraction, by estimation, is 55 to 60%. The  left ventricle has normal function. The left ventricle has no regional  wall motion abnormalities. There is mild concentric left ventricular  hypertrophy. Left ventricular diastolic  parameters were normal.   3. Right ventricular systolic function is normal. The right ventricular  size is normal. There is normal pulmonary artery systolic pressure.   4. There is no evidence of pericardial effusion.   5. The mitral valve is normal in structure. Trivial mitral valve  regurgitation. No evidence of mitral stenosis.   6. The aortic valve was not well visualized. Aortic valve regurgitation  is not visualized. No aortic stenosis is present.   7. The inferior vena cava is normal in size with greater than 50%  respiratory variability, suggesting right atrial pressure of 3 mmHg.   Conclusion(s)/Recommendation(s): Normal LV and RV systolic function with  mild LVH. The morphologies of the aortic and mitral valves were poorly  visualized. Within these limitations, overall no significant findings.   Labs:    Latest Ref Rng & Units 04/26/2021    3:07 PM 03/06/2020    6:48 PM 05/11/2018    6:44 PM  CBC  WBC 4.0 - 10.5 K/uL 7.9  6.8  8.4   Hemoglobin 13.0 - 17.0 g/dL 83.8  84.1  83.0   Hematocrit 39.0 - 52.0 % 45.6  45.9  50.0   Platelets 150 - 400 K/uL 256  296  288        Latest Ref Rng & Units 04/26/2021    3:07 PM 03/06/2020    6:48 PM 05/11/2018    6:44 PM  BMP  Glucose 70 - 99 mg/dL 898  98  96   BUN 6 - 20 mg/dL 10  11  9    Creatinine 0.61 - 1.24 mg/dL 9.16  9.24  9.27   Sodium 135 - 145 mmol/L 134  138  137   Potassium 3.5 - 5.1 mmol/L 3.8  4.1  4.3   Chloride 98 - 111 mmol/L 105  105  104   CO2 22 - 32 mmol/L  23  24  23    Calcium 8.9 - 10.3 mg/dL 9.5  9.7  9.9       Latest Ref Rng & Units 04/26/2021    3:07 PM 03/06/2020    6:48 PM 05/11/2018    6:44 PM  CMP  Glucose 70 - 99 mg/dL 898  98  96   BUN 6 - 20 mg/dL 10  11  9    Creatinine 0.61 - 1.24 mg/dL 9.16  9.24  9.27   Sodium 135 - 145 mmol/L 134  138  137  Potassium 3.5 - 5.1 mmol/L 3.8  4.1  4.3   Chloride 98 - 111 mmol/L 105  105  104   CO2 22 - 32 mmol/L 23  24  23    Calcium 8.9 - 10.3 mg/dL 9.5  9.7  9.9     No results found for: CHOL, HDL, LDLCALC, LDLDIRECT, TRIG, CHOLHDL No results for input(s): LIPOA in the last 8760 hours. No components found for: NTPROBNP No results for input(s): PROBNP in the last 8760 hours. No results for input(s): TSH in the last 8760 hours.  Physical Exam:    Today's Vitals   03/13/24 1507  BP: (!) 144/88  Pulse: 86  SpO2: 98%  Weight: 175 lb 12.8 oz (79.7 kg)  Height: 6' 3 (1.905 m)   Body mass index is 21.97 kg/m. Wt Readings from Last 3 Encounters:  03/13/24 175 lb 12.8 oz (79.7 kg)  12/16/22 189 lb (85.7 kg)  12/14/22 187 lb 2.7 oz (84.9 kg)    Physical Exam  Constitutional: No distress. He appears chronically ill.  hemodynamically stable  Neck: No JVD present.  Cardiovascular: Normal rate, regular rhythm, S1 normal and S2 normal. Exam reveals no gallop, no S3 and no S4.  No murmur heard. Pulmonary/Chest: Effort normal and breath sounds normal. No stridor. He has no wheezes. He has no rales.  Musculoskeletal:        General: No edema.     Cervical back: Neck supple.  Skin: Skin is warm.     Impression & Recommendation(s):  Impression:   ICD-10-CM   1. Precordial pain  R07.2 EKG 12-Lead    CT CORONARY MORPH W/CTA COR W/SCORE W/CA W/CM &/OR WO/CM    metoprolol  tartrate (LOPRESSOR ) 50 MG tablet    Basic Metabolic Panel (BMET)    2. Benign hypertension  I10     3. Mixed hyperlipidemia  E78.2     4. Cigarette smoker  F17.210     5. Marijuana abuse   F12.10        Recommendation(s):  Precordial pain Suggestive of cardiac discomfort. EKG is nonischemic. Echocardiogram October 2025 independently reviewed Coronary CTA to evaluate for CAC, plaque burden, and obstructive disease Continue Crestor 10 mg p.o. daily. Reemphasized importance of complete smoking cessation  Benign hypertension Office blood pressures are acceptable, not at goal. Currently on losartan 25 mg p.o. daily  Mixed hyperlipidemia Continue rosuvastatin 10 mg p.o. daily No recent lipid profile available for review. Cardiology following peripherally, managed by primary care provider.  Cigarette smoker Tobacco cessation counseling: Currently smoking 0.5 packs/day, history of 1.5 packs/day He is informed of the dangers of tobacco abuse including stroke, cancer, and MI, as well as benefits of tobacco cessation. He is willing to quit at this time.  Currently using nicotine  patches and nicotine  to pick 5 mins were spent counseling patient cessation techniques. We discussed various methods to help quit smoking, including deciding on a date to quit, joining a support group, pharmacological agents- nicotine  gum/patch/lozenges.  I will reassess his progress at the next follow-up visit  Marijuana abuse Reemphasized importance of complete cessation.   Orders Placed:  Orders Placed This Encounter  Procedures   CT CORONARY MORPH W/CTA COR W/SCORE W/CA W/CM &/OR WO/CM    Standing Status:   Future    Expected Date:   03/27/2024    Expiration Date:   03/13/2025    If indicated for the ordered procedure, I authorize the administration of contrast media per Radiology protocol:   Yes  Does the patient have a contrast media/X-ray dye allergy?:   No    [facility]    Authorization::   BMI less than 55   Basic Metabolic Panel (BMET)   EKG 12-Lead     Final Medication List:    Meds ordered this encounter  Medications    metoprolol  tartrate (LOPRESSOR ) 50 MG tablet    Sig: Take 1 tablet (50 mg total) by mouth 2 (two) times daily. For one (1) week prior to your scan.    Dispense:  14 tablet    Refill:  0    Medications Discontinued During This Encounter  Medication Reason   acetaminophen  (TYLENOL ) 500 MG tablet Patient Preference   TRELEGY ELLIPTA 100-62.5-25 MCG/ACT AEPB Patient Preference   tiZANidine  (ZANAFLEX ) 4 MG capsule Patient Preference   predniSONE  (DELTASONE ) 20 MG tablet Patient Preference    Current Medications[2]  Consent:   N/A  Disposition:   4 weeks sooner if needed. Patient may be asked to follow-up sooner based on the results of the above-mentioned testing.   Signed, Madonna Large, DO, [facility]  A Division of [facility] [address] [place], [place] [address]     [1]  Current Meds  Medication Sig   albuterol  (VENTOLIN  HFA) 108 (90 Base) MCG/ACT inhaler Inhale 2 puffs into the lungs every 4 (four) hours as needed for wheezing or shortness of breath.   Fluticasone-Umeclidin-Vilant (TRELEGY ELLIPTA) 100-62.5-25 MCG/ACT AEPB Inhale into the lungs.   losartan (COZAAR) 25 MG tablet Take 1 tablet by mouth daily.   metoprolol  tartrate (LOPRESSOR ) 50 MG tablet Take 1 tablet (50 mg total) by mouth 2 (two) times daily. For one (1) week prior to your scan.   rosuvastatin (CRESTOR) 10 MG tablet Take 10 mg by mouth daily.  [2]  Current Outpatient Medications:    albuterol  (VENTOLIN  HFA) 108 (90 Base) MCG/ACT inhaler, Inhale 2 puffs into the lungs every 4 (four) hours as needed for wheezing or shortness of breath., Disp: 18 g, Rfl: 0   Fluticasone-Umeclidin-Vilant (TRELEGY ELLIPTA) 100-62.5-25 MCG/ACT AEPB, Inhale into the lungs., Disp: , Rfl:    losartan (COZAAR) 25 MG tablet, Take 1 tablet by mouth daily., Disp: , Rfl:    metoprolol  tartrate (LOPRESSOR ) 50 MG tablet, Take 1 tablet (50 mg total) by mouth 2 (two) times daily. For one (1) week prior to  your scan., Disp: 14 tablet, Rfl: 0   rosuvastatin (CRESTOR) 10 MG tablet, Take 10 mg by mouth daily., Disp: , Rfl:

## 2024-03-30 ENCOUNTER — Encounter (HOSPITAL_COMMUNITY): Payer: Self-pay

## 2024-04-02 ENCOUNTER — Ambulatory Visit (HOSPITAL_COMMUNITY)
Admission: RE | Admit: 2024-04-02 | Discharge: 2024-04-02 | Disposition: A | Discharge status: Home / Self Care | Source: Ambulatory Visit | Attending: Cardiology | Admitting: Cardiology

## 2024-04-02 ENCOUNTER — Ambulatory Visit: Payer: Self-pay | Admitting: Cardiology

## 2024-04-02 DIAGNOSIS — R072 Precordial pain: Secondary | ICD-10-CM | POA: Diagnosis not present

## 2024-04-02 LAB — POCT I-STAT CREATININE: Creatinine, Ser: 1 mg/dL (ref 0.61–1.24)

## 2024-04-02 MED ORDER — IOHEXOL 350 MG/ML SOLN
100.0000 mL | Freq: Once | INTRAVENOUS | Status: AC | PRN
  Administered 2024-04-02: 100 mL via INTRAVENOUS

## 2024-04-02 MED ORDER — NITROGLYCERIN 0.4 MG SL SUBL
0.8000 mg | SUBLINGUAL_TABLET | Freq: Once | SUBLINGUAL | Status: AC
  Administered 2024-04-02: 0.8 mg via SUBLINGUAL

## 2024-04-10 ENCOUNTER — Ambulatory Visit
Admission: RE | Admit: 2024-04-10 | Discharge: 2024-04-10 | Disposition: A | Discharge status: Home / Self Care | Payer: Self-pay | Source: Ambulatory Visit | Attending: Nurse Practitioner | Admitting: Nurse Practitioner

## 2024-04-10 ENCOUNTER — Ambulatory Visit (INDEPENDENT_AMBULATORY_CARE_PROVIDER_SITE_OTHER)

## 2024-04-10 ENCOUNTER — Ambulatory Visit: Payer: Self-pay

## 2024-04-10 VITALS — BP 184/112 | HR 87 | Temp 97.9°F | Resp 18

## 2024-04-10 DIAGNOSIS — S298XXA Other specified injuries of thorax, initial encounter: Secondary | ICD-10-CM

## 2024-04-10 NOTE — Discharge Instructions (Addendum)
 The x-ray was negative for a rib fracture or dislocation.  Symptoms are consistent with a bruise of your rib. You may continue over-the-counter Tylenol  as needed for pain or discomfort.  You may also take gabapentin  as needed for pain. Apply warm compresses to the affected area to help with pain or discomfort.  You may also apply ice.  Apply for 20 minutes, remove for 1 hour, repeat as needed while symptoms persist. Recommend using a pillow to splint for coughing and deep breathing as needed.  Make sure you are coughing and deep breathing to prevent the development of pneumonia. Go to the emergency department if you experience shortness of breath, difficulty breathing, or other concerns. Follow-up as needed.

## 2024-04-10 NOTE — ED Triage Notes (Signed)
 Fell on right side 2 days ago.  Hurts to breath in and cough on right side

## 2024-04-10 NOTE — ED Provider Notes (Signed)
 " [facility]    CSN: [id number] Arrival date & time: 04/10/24  1156      History   Chief Complaint Chief Complaint  Patient presents with   Rib Injury    Clemens now rib hurt to move and breathe - Entered by patient    HPI Howard Bishop is a 49 y.o. male.   The history is provided by the patient.   Patient presents with a 2-day history of right sided rib cage pain.  Patient states that he fell over a chair.  States since that time, he has noticed bruising to his right side, but pain to the frontal area of his right rib cage.  He complains of pain with coughing, deep breathing, and movement.  States he has been taking over-the-counter Tylenol  and ibuprofen  and using heat for his symptoms.  He denies shortness of breath, difficulty breathing, wheezing, cough, or swelling.  Patient reports that he does have underlying history of COPD.  Past Medical History:  Diagnosis Date   ADHD (attention deficit hyperactivity disorder)    Anxiety    Bipolar disorder (HCC)    Chronic kidney disease    COPD (chronic obstructive pulmonary disease) (HCC)    DDD (degenerative disc disease), cervical    Dyspnea    Gallstone    Headache(784.0)    Hepatitis C antibody test positive    Hx of concussion    Hypertension    PTSD (post-traumatic stress disorder)    Schizoaffective disorder (HCC)    Seizures (HCC)     Patient Active Problem List   Diagnosis Date Noted   Adenomatous polyp of transverse colon 12/16/2022   Seizures (HCC) 04/01/2015   Alcohol abuse 01/18/2014   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   PTSD (post-traumatic stress disorder)    Hepatitis C antibody test positive    Headache     Past Surgical History:  Procedure Laterality Date   CHOLECYSTECTOMY     COLONOSCOPY WITH PROPOFOL  N/A 12/16/2022   Procedure: COLONOSCOPY WITH PROPOFOL ;  Surgeon: Cinderella Deatrice FALCON, MD;  [facility];  Service: Endoscopy;  Laterality: N/A;   7:30 am, asa 3   POLYPECTOMY  12/16/2022   Procedure: POLYPECTOMY;  Surgeon: Cinderella Deatrice FALCON, MD;  [facility];  Service: Endoscopy;;   RENAL ARTERY STENT     VASECTOMY  12/2021       Home Medications    Prior to Admission medications  Medication Sig Start Date End Date Taking? Authorizing Provider  albuterol  (VENTOLIN  HFA) 108 (90 Base) MCG/ACT inhaler Inhale 2 puffs into the lungs every 4 (four) hours as needed for wheezing or shortness of breath. 03/09/22   Stuart Vernell Norris, PA-C  Fluticasone-Umeclidin-Vilant (TRELEGY ELLIPTA) 100-62.5-25 MCG/ACT AEPB Inhale into the lungs.    [provider]  losartan (COZAAR) 25 MG tablet Take 1 tablet by mouth daily.    [provider]  rosuvastatin (CRESTOR) 10 MG tablet Take 10 mg by mouth daily.    [provider]    Family History Family History  Problem Relation Age of Onset   Drug abuse Mother    Alcohol abuse Father    Anxiety disorder Sister    Drug abuse Sister    Colon cancer Paternal Grandmother        41s    Social History Social History[1]   Allergies   Aspirin, Penicillins, Tomato, Ambien [zolpidem], Bee venom, Ketorolac, Paroxetine hcl, Tramadol hcl, and Ultram [  tramadol]   Review of Systems Review of Systems Per HPI  Physical Exam Triage Vital Signs ED Triage Vitals  Encounter Vitals Group     BP 04/10/24 1204 (!) 184/112     Girls Systolic BP Percentile --      Girls Diastolic BP Percentile --      Boys Systolic BP Percentile --      Boys Diastolic BP Percentile --      Pulse Rate 04/10/24 1204 87     Resp 04/10/24 1204 18     Temp 04/10/24 1204 97.9 F (36.6 C)     Temp Source 04/10/24 1204 Oral     SpO2 04/10/24 1204 98 %     Weight --      Height --      Head Circumference --      Peak Flow --      Pain Score 04/10/24 1205 7     Pain Loc --      Pain Education --      Exclude from Growth Chart --    No data found.  Updated Vital Signs BP (!)  184/112 (BP Location: Right Arm) Comment: states has not taken BP medication  Pulse 87   Temp 97.9 F (36.6 C) (Oral)   Resp 18   SpO2 98%   Visual Acuity Right Eye Distance:   Left Eye Distance:   Bilateral Distance:    Right Eye Near:   Left Eye Near:    Bilateral Near:     Physical Exam Vitals and nursing note reviewed.  Constitutional:      General: He is not in acute distress.    Appearance: Normal appearance.  HENT:     Head: Normocephalic.  Eyes:     Extraocular Movements: Extraocular movements intact.     Conjunctiva/sclera: Conjunctivae normal.     Pupils: Pupils are equal, round, and reactive to light.  Cardiovascular:     Rate and Rhythm: Normal rate and regular rhythm.     Pulses: Normal pulses.     Heart sounds: Normal heart sounds.  Pulmonary:     Effort: Pulmonary effort is normal. No respiratory distress.     Breath sounds: Normal breath sounds. No stridor. No wheezing, rhonchi or rales.  Chest:     Chest wall: Tenderness present.    Abdominal:     General: Bowel sounds are normal.     Palpations: Abdomen is soft.   Musculoskeletal:     Cervical back: Normal range of motion.  Skin:    General: Skin is warm and dry.  Neurological:     General: No focal deficit present.     Mental Status: He is alert and oriented to person, place, and time.  Psychiatric:        Mood and Affect: Mood normal.        Behavior: Behavior normal.      UC Treatments / Results  Labs (all labs ordered are listed, but only abnormal results are displayed) Labs Reviewed - No data to display  EKG   Radiology DG Ribs Unilateral W/Chest Right Result Date: 04/10/2024 CLINICAL DATA:  Fall 2 days ago with right rib pain. EXAM: RIGHT RIBS AND CHEST - 3+ VIEW COMPARISON:  None Available. FINDINGS: Lungs are adequately inflated without focal airspace consolidation or effusion. No pneumothorax. Cardiomediastinal silhouette is normal. No acute right rib fracture. IMPRESSION:  1. No acute cardiopulmonary disease. 2. No acute right rib fracture. Electronically Signed   By:  Toribio Agreste M.D.   On: 04/10/2024 12:35    Procedures Procedures (including critical care time)  Medications Ordered in UC Medications - No data to display  Initial Impression / Assessment and Plan / UC Course  I have reviewed the triage vital signs and the nursing notes.  Pertinent labs & imaging results that were available during my care of the patient were reviewed by me and considered in my medical decision making (see chart for details).  X-ray of the right chest was negative for rib fracture or dislocation.  Symptoms consistent with a contusion.  Supportive care recommendations were provided and discussed with the patient to include over-the-counter analgesics, warm compresses, and to use a pillow to splint with coughing and deep breathing.  Discussed indications with the patient regarding follow-up along with strict ER follow-up precautions.  Patient was in agreement with this plan of care and verbalizes understanding.  All questions were answered.  Patient stable for discharge.   Final Clinical Impressions(s) / UC Diagnoses   Final diagnoses:  Rib contusion, right, initial encounter     Discharge Instructions      The x-ray was negative for a rib fracture or dislocation.  Symptoms are consistent with a bruise of your rib. You may continue over-the-counter Tylenol  as needed for pain or discomfort.  You may also take gabapentin  as needed for pain. Apply warm compresses to the affected area to help with pain or discomfort.  You may also apply ice.  Apply for 20 minutes, remove for 1 hour, repeat as needed while symptoms persist. Recommend using a pillow to splint for coughing and deep breathing as needed.  Make sure you are coughing and deep breathing to prevent the development of pneumonia. Go to the emergency department if you experience shortness of breath, difficulty breathing,  or other concerns. Follow-up as needed.      ED Prescriptions   None    PDMP not reviewed this encounter.    [1]  Social History Tobacco Use   Smoking status: Every Day    Current packs/day: 1.00    Average packs/day: 1 pack/day for 34.0 years (34.0 ttl pk-yrs)    Types: Cigarettes    Start date: 03/29/1990   Smokeless tobacco: Former    Types: Engineer, Drilling   Vaping status: Never Used  Substance Use Topics   Alcohol use: Yes    Comment: 24 oz beer a day   Drug use: Yes    Frequency: 7.0 times per week    Types: Marijuana    Comment: 3 grams per day     Gilmer Etta PARAS, NP 04/10/24 1243  "

## 2024-04-12 ENCOUNTER — Ambulatory Visit: Payer: Self-pay | Admitting: Cardiology

## 2024-04-26 ENCOUNTER — Ambulatory Visit: Admitting: Cardiology
